# Patient Record
Sex: Male | Born: 1970 | Hispanic: No | Marital: Married | Smoking: Current every day smoker
Health system: Southern US, Community
[De-identification: ages and names within clinical notes are randomized; demographics above are authoritative.]

## PROBLEM LIST (undated history)

## (undated) DIAGNOSIS — E785 Hyperlipidemia, unspecified: Secondary | ICD-10-CM

## (undated) DIAGNOSIS — K219 Gastro-esophageal reflux disease without esophagitis: Secondary | ICD-10-CM

## (undated) DIAGNOSIS — G8929 Other chronic pain: Secondary | ICD-10-CM

## (undated) DIAGNOSIS — I10 Essential (primary) hypertension: Secondary | ICD-10-CM

## (undated) DIAGNOSIS — G4733 Obstructive sleep apnea (adult) (pediatric): Secondary | ICD-10-CM

## (undated) DIAGNOSIS — M25512 Pain in left shoulder: Secondary | ICD-10-CM

## (undated) DIAGNOSIS — G4730 Sleep apnea, unspecified: Secondary | ICD-10-CM

## (undated) DIAGNOSIS — M2550 Pain in unspecified joint: Secondary | ICD-10-CM

## (undated) DIAGNOSIS — D72829 Elevated white blood cell count, unspecified: Secondary | ICD-10-CM

## (undated) HISTORY — PX: KNEE SURGERY: SHX244

## (undated) HISTORY — PX: FOOT SURGERY: SHX648

## (undated) HISTORY — DX: Essential (primary) hypertension: I10

## (undated) HISTORY — DX: Elevated white blood cell count, unspecified: D72.829

## (undated) HISTORY — DX: Hyperlipidemia, unspecified: E78.5

## (undated) HISTORY — DX: Sleep apnea, unspecified: G47.30

## (undated) HISTORY — PX: ANTERIOR CRUCIATE LIGAMENT REPAIR: SHX115

## (undated) HISTORY — PX: APPENDECTOMY: SHX54

## (undated) HISTORY — DX: Gastro-esophageal reflux disease without esophagitis: K21.9

## (undated) HISTORY — DX: Pain in unspecified joint: M25.50

## (undated) HISTORY — DX: Obstructive sleep apnea (adult) (pediatric): G47.33

---

## 2001-04-05 ENCOUNTER — Ambulatory Visit (HOSPITAL_COMMUNITY): Admission: RE | Admit: 2001-04-05 | Discharge: 2001-04-05 | Payer: Self-pay | Admitting: Gastroenterology

## 2003-06-06 ENCOUNTER — Emergency Department (HOSPITAL_COMMUNITY): Admission: EM | Admit: 2003-06-06 | Discharge: 2003-06-07 | Payer: Self-pay | Admitting: Emergency Medicine

## 2003-08-24 ENCOUNTER — Emergency Department (HOSPITAL_COMMUNITY): Admission: EM | Admit: 2003-08-24 | Discharge: 2003-08-24 | Payer: Self-pay | Admitting: Family Medicine

## 2004-02-11 ENCOUNTER — Ambulatory Visit (HOSPITAL_COMMUNITY): Admission: RE | Admit: 2004-02-11 | Discharge: 2004-02-11 | Payer: Self-pay | Admitting: Internal Medicine

## 2004-04-06 ENCOUNTER — Encounter: Payer: Self-pay | Admitting: Emergency Medicine

## 2004-04-06 ENCOUNTER — Observation Stay (HOSPITAL_COMMUNITY): Admission: EM | Admit: 2004-04-06 | Discharge: 2004-04-07 | Payer: Self-pay | Admitting: General Surgery

## 2004-04-06 ENCOUNTER — Encounter (INDEPENDENT_AMBULATORY_CARE_PROVIDER_SITE_OTHER): Payer: Self-pay | Admitting: Specialist

## 2004-04-09 ENCOUNTER — Emergency Department (HOSPITAL_COMMUNITY): Admission: EM | Admit: 2004-04-09 | Discharge: 2004-04-09 | Payer: Self-pay | Admitting: Emergency Medicine

## 2004-09-11 ENCOUNTER — Emergency Department (HOSPITAL_COMMUNITY): Admission: EM | Admit: 2004-09-11 | Discharge: 2004-09-11 | Payer: Self-pay | Admitting: Emergency Medicine

## 2005-09-21 ENCOUNTER — Ambulatory Visit: Payer: Self-pay | Admitting: Internal Medicine

## 2005-10-10 ENCOUNTER — Ambulatory Visit: Payer: Self-pay | Admitting: Pulmonary Disease

## 2005-10-17 ENCOUNTER — Ambulatory Visit (HOSPITAL_COMMUNITY): Admission: RE | Admit: 2005-10-17 | Discharge: 2005-10-17 | Payer: Self-pay | Admitting: Pulmonary Disease

## 2005-12-10 ENCOUNTER — Ambulatory Visit (HOSPITAL_BASED_OUTPATIENT_CLINIC_OR_DEPARTMENT_OTHER): Admission: RE | Admit: 2005-12-10 | Discharge: 2005-12-10 | Payer: Self-pay | Admitting: Pulmonary Disease

## 2006-01-11 ENCOUNTER — Ambulatory Visit: Payer: Self-pay | Admitting: Pulmonary Disease

## 2006-03-27 ENCOUNTER — Ambulatory Visit: Payer: Self-pay | Admitting: Pulmonary Disease

## 2006-04-12 ENCOUNTER — Encounter: Admission: RE | Admit: 2006-04-12 | Discharge: 2006-04-12 | Payer: Self-pay | Admitting: Neurology

## 2007-01-18 ENCOUNTER — Ambulatory Visit: Payer: Self-pay | Admitting: Internal Medicine

## 2007-01-23 ENCOUNTER — Ambulatory Visit: Payer: Self-pay | Admitting: Internal Medicine

## 2007-01-23 LAB — CONVERTED CEMR LAB
BUN: 10 mg/dL (ref 6–23)
Basophils Relative: 0.2 % (ref 0.0–1.0)
Bilirubin, Direct: 0.1 mg/dL (ref 0.0–0.3)
CO2: 26 meq/L (ref 19–32)
Eosinophils Relative: 2.2 % (ref 0.0–5.0)
GFR calc Af Amer: 109 mL/min
Glucose, Bld: 93 mg/dL (ref 70–99)
HDL: 28.7 mg/dL — ABNORMAL LOW (ref >39.0)
Hemoglobin: 15.6 g/dL (ref 13.0–17.0)
Lymphocytes Relative: 39.5 % (ref 12.0–46.0)
Monocytes Absolute: 1 10*3/uL — ABNORMAL HIGH (ref 0.2–0.7)
Monocytes Relative: 9.6 % (ref 3.0–11.0)
Neutro Abs: 5.4 10*3/uL (ref 1.4–7.7)
Potassium: 3.9 meq/L (ref 3.5–5.1)
Sodium: 141 meq/L (ref 135–145)
TSH: 1.77 microintl units/mL (ref 0.35–5.50)
Testosterone: 486.15 ng/dL (ref 350.00–890)
Total Protein: 6.8 g/dL (ref 6.0–8.3)
VLDL: 21 mg/dL (ref 0–40)

## 2007-03-01 ENCOUNTER — Encounter: Payer: Self-pay | Admitting: Internal Medicine

## 2007-03-26 ENCOUNTER — Emergency Department (HOSPITAL_COMMUNITY): Admission: EM | Admit: 2007-03-26 | Discharge: 2007-03-26 | Payer: Self-pay | Admitting: Family Medicine

## 2007-04-03 ENCOUNTER — Ambulatory Visit: Payer: Self-pay | Admitting: Internal Medicine

## 2007-04-03 DIAGNOSIS — E785 Hyperlipidemia, unspecified: Secondary | ICD-10-CM | POA: Insufficient documentation

## 2007-04-03 DIAGNOSIS — L259 Unspecified contact dermatitis, unspecified cause: Secondary | ICD-10-CM | POA: Insufficient documentation

## 2007-05-07 ENCOUNTER — Encounter (INDEPENDENT_AMBULATORY_CARE_PROVIDER_SITE_OTHER): Payer: Self-pay | Admitting: *Deleted

## 2007-05-09 ENCOUNTER — Telehealth: Payer: Self-pay | Admitting: Internal Medicine

## 2007-05-20 ENCOUNTER — Encounter: Payer: Self-pay | Admitting: Internal Medicine

## 2007-05-23 ENCOUNTER — Ambulatory Visit: Payer: Self-pay | Admitting: Internal Medicine

## 2007-05-30 ENCOUNTER — Emergency Department (HOSPITAL_COMMUNITY): Admission: EM | Admit: 2007-05-30 | Discharge: 2007-05-31 | Payer: Self-pay | Admitting: Emergency Medicine

## 2007-05-31 ENCOUNTER — Ambulatory Visit: Payer: Self-pay | Admitting: Internal Medicine

## 2007-05-31 DIAGNOSIS — J029 Acute pharyngitis, unspecified: Secondary | ICD-10-CM | POA: Insufficient documentation

## 2007-10-28 ENCOUNTER — Encounter: Payer: Self-pay | Admitting: Internal Medicine

## 2008-12-17 ENCOUNTER — Emergency Department (HOSPITAL_BASED_OUTPATIENT_CLINIC_OR_DEPARTMENT_OTHER): Admission: EM | Admit: 2008-12-17 | Discharge: 2008-12-17 | Payer: Self-pay | Admitting: Emergency Medicine

## 2008-12-28 ENCOUNTER — Encounter: Payer: Self-pay | Admitting: Internal Medicine

## 2009-05-19 ENCOUNTER — Ambulatory Visit (HOSPITAL_COMMUNITY): Admission: RE | Admit: 2009-05-19 | Discharge: 2009-05-19 | Payer: Self-pay | Admitting: Orthopedic Surgery

## 2009-06-22 ENCOUNTER — Emergency Department (HOSPITAL_COMMUNITY): Admission: EM | Admit: 2009-06-22 | Discharge: 2009-06-22 | Payer: Self-pay | Admitting: Emergency Medicine

## 2009-10-21 ENCOUNTER — Emergency Department (HOSPITAL_COMMUNITY): Admission: EM | Admit: 2009-10-21 | Discharge: 2009-10-21 | Payer: Self-pay | Admitting: Emergency Medicine

## 2010-01-14 ENCOUNTER — Ambulatory Visit
Admission: RE | Admit: 2010-01-14 | Discharge: 2010-01-14 | Payer: Self-pay | Source: Home / Self Care | Attending: Internal Medicine | Admitting: Internal Medicine

## 2010-01-14 ENCOUNTER — Encounter: Payer: Self-pay | Admitting: Internal Medicine

## 2010-01-14 LAB — CONVERTED CEMR LAB
Calcium: 9.6 mg/dL (ref 8.4–10.5)
Cholesterol: 195 mg/dL (ref 0–200)
HDL: 34 mg/dL — ABNORMAL LOW (ref >39)
Sodium: 141 meq/L (ref 135–145)
Total CHOL/HDL Ratio: 5.7

## 2010-01-25 ENCOUNTER — Encounter: Payer: Self-pay | Admitting: Internal Medicine

## 2010-01-26 DIAGNOSIS — G4700 Insomnia, unspecified: Secondary | ICD-10-CM | POA: Insufficient documentation

## 2010-02-03 NOTE — Letter (Signed)
   [facility] [address] [place], [place]  [address]  [place] Phone: [phone]      January 25, 2010   [facility] [address] [place], [place] [address]  RE:  LAB RESULTS  Dear  Mr. Fichter,  The following is an interpretation of your most recent lab tests.  Please take note of any instructions provided or changes to medications that have resulted from your lab work.  ELECTROLYTES:  Good - no changes needed  KIDNEY FUNCTION TESTS:  Good - no changes needed  LIPID PANEL:  Fair - review at your next visit Triglyceride: 227   Cholesterol: 195   LDL: 116   HDL: 34   Chol/HDL%:  5.7 Ratio   C Reactive Protein - 1.9 (borderline)   Please follow low saturated fat diet.  I suggest you take vitatmin D supplement 1000 units once daily (over the counter).  Regular exercise can also help lower cholesterol levels.     Sincerely Yours,    Dr. Thomos Lemons  Appended Document:  mailed

## 2010-02-03 NOTE — Assessment & Plan Note (Signed)
Summary: bp med refill/mhf   Vital Signs:  Patient profile:   40 year old male Height:      67.5 inches Weight:      173.50 pounds BMI:     26.87 O2 Sat:      100 % on Room air Temp:     97.6 degrees F oral Pulse rate:   81 / minute Resp:     16 per minute BP sitting:   124 / 80  (right arm) Cuff size:   large  Vitals Entered By: Glendell Docker CMA (January 14, 2010 8:22 AM)  O2 Flow:  Room air CC: Medication Refills Is Patient Diabetic? No Pain Assessment Patient in pain? no        Primary Care Provider:  Dondra Spry DO  CC:  Medication Refills.  History of Present Illness:  Hypertension Follow-Up      This is a 40 year old man who presents for Hypertension follow-up.  The patient denies lightheadedness, headaches, edema, and impotence.  The patient denies the following associated symptoms: chest pain.    chronic insomnia - no change  Preventive Screening-Counseling & Management  Alcohol-Tobacco     Alcohol drinks/day: 0     Smoking Status: quit  Caffeine-Diet-Exercise     Caffeine use/day: 3 beverages daily     Does Patient Exercise: no  Allergies (verified): No Known Drug Allergies  Past History:  Past Medical History: Hypertension Hyperlipidemia    Social History: Does Patient Exercise:  no Caffeine use/day:  3 beverages daily  Physical Exam  General:  alert, well-developed, and well-nourished.   Neck:  supple, no masses, and no carotid bruits.   Lungs:  normal respiratory effort and normal breath sounds.   Heart:  normal rate, regular rhythm, and no gallop.     Complete Medication List: 1)  Zolpidem Tartrate 10 Mg Tabs (Zolpidem tartrate) .... One by mouth at bedtime as needed 2)  Bystolic 10 Mg Tabs (Nebivolol hcl) .... Take 1 tablet by mouth once a day  Other Orders: T-Basic Metabolic Panel [id number]) T-Lipid Profile [phone]) CRP, high sensitivity-[facility] ([id number])  Patient Instructions: 1)  Please schedule a follow-up  appointment in 6 months. Prescriptions: ZOLPIDEM TARTRATE 10 MG TABS (ZOLPIDEM TARTRATE) one by mouth at bedtime as needed  #90 x 1   Entered and Authorized by:   D. Thomos Lemons DO   Signed by:   D. Thomos Lemons DO on 01/14/2010   Method used:   Print then Give to Patient   RxID:   [id number] BYSTOLIC 10 MG TABS (NEBIVOLOL HCL) Take 1 tablet by mouth once a day  #90 x 1   Entered and Authorized by:   D. Thomos Lemons DO   Signed by:   D. Thomos Lemons DO on 01/14/2010   Method used:   Electronically to        [facility]* (retail)             ,          Ph: [phone]       Fax: [phone]   RxID:   [id number]    Orders Added: 1)  T-Basic Metabolic Panel [id number] 2)  T-Lipid Profile [[id number]] 3)  CRP, high sensitivity-[facility] [[id number]] 4)  Est. Patient Level III [71062]   Immunization History:  Influenza Immunization History:    Influenza:  declined (01/14/2010)   Contraindications/Deferment of Procedures/Staging:    Test/Procedure: FLU VAX    Reason for deferment: patient declined  Immunization History:  Influenza Immunization History:    Influenza:  Declined (01/14/2010)    Current Allergies (reviewed today): No known allergies   Appended Document: bp med refill/mhf      Impression & Recommendations:  Problem # 1:  HYPERTENSION (ICD-401.9) Assessment Improved  His updated medication list for this problem includes:    Bystolic 10 Mg Tabs (Nebivolol hcl) .Marland Kitchen... Take 1 tablet by mouth once a day  Prior BP: 124/80 (01/14/2010)  Labs Reviewed: K+: 4.3 (01/14/2010) Creat: : 1.00 (01/14/2010)   Chol: 195 (01/14/2010)   HDL: 34 (01/14/2010)   LDL: 116 (01/14/2010)   TG: 227 (01/14/2010)  Problem # 2:  INSOMNIA, CHRONIC (ICD-307.42) Assessment: Unchanged  Complete Medication List: 1)  Zolpidem Tartrate 10 Mg Tabs (Zolpidem tartrate) .... One by mouth at bedtime as needed 2)  Bystolic 10 Mg Tabs (Nebivolol hcl) .... Take 1 tablet by  mouth once a day

## 2010-03-21 LAB — URINALYSIS, ROUTINE W REFLEX MICROSCOPIC
Bilirubin Urine: NEGATIVE
Ketones, ur: NEGATIVE mg/dL
Nitrite: NEGATIVE
Protein, ur: NEGATIVE mg/dL
Urobilinogen, UA: 0.2 mg/dL (ref 0.0–1.0)
pH: 5 (ref 5.0–8.0)

## 2010-03-21 LAB — COMPREHENSIVE METABOLIC PANEL
Albumin: 4.4 g/dL (ref 3.5–5.2)
BUN: 11 mg/dL (ref 6–23)
Chloride: 109 mEq/L (ref 96–112)
Creatinine, Ser: 0.99 mg/dL (ref 0.4–1.5)
Glucose, Bld: 89 mg/dL (ref 70–99)
Total Bilirubin: 0.6 mg/dL (ref 0.3–1.2)

## 2010-03-21 LAB — DIFFERENTIAL
Basophils Absolute: 0 10*3/uL (ref 0.0–0.1)
Lymphocytes Relative: 21 % (ref 12–46)
Monocytes Absolute: 1.5 10*3/uL — ABNORMAL HIGH (ref 0.1–1.0)
Neutro Abs: 10.8 10*3/uL — ABNORMAL HIGH (ref 1.7–7.7)
Neutrophils Relative %: 68 % (ref 43–77)

## 2010-03-21 LAB — CBC
HCT: 45.3 % (ref 39.0–52.0)
MCV: 91.3 fL (ref 78.0–100.0)
Platelets: 244 10*3/uL (ref 150–400)
RDW: 13 % (ref 11.5–15.5)

## 2010-03-21 LAB — PROTIME-INR
INR: 0.98 (ref 0.00–1.49)
Prothrombin Time: 12.9 seconds (ref 11.6–15.2)

## 2010-03-21 LAB — APTT: aPTT: 32 seconds (ref 24–37)

## 2010-05-20 NOTE — Procedures (Signed)
EEG NUMBER:  [id number]   REQUESTING PHYSICIAN:  Barbaraann Share, MD, [facility]   CLINICAL HISTORY:  This is a routine EEG done with photic stimulation and  hyperventilation the patient describes as awake and asleep.  This is a 40-  year-old man being evaluated for nocturnal seizures described as tonic  clonic events.   DESCRIPTION:  The dominant rhythm in this tracing is a moderate amplitude  alpha rhythm of 10 Hertz which predominates posteriorly.  Appears without  abnormal asymmetry and attenuates with eye opening and closing.  Low  amplitude fast activity seen frontally and centrally and appears without  abnormal asymmetry.  No focal slowing is noted.  No epileptiform discharges  are seen.  Drowsiness occurs naturally and is evidenced by fragmentation of  the background and generalized attenuation of the rhythms.  In the middle of  the recording stage, stage 2 sleep was recorded as evidenced by the  appearance of sleep spindles and some vertex waves.  No abnormalities were  seen in these stages of sleep.  Photic stimulation produced symmetric  driving responses.  Hyperventilation produced mild buildup of the background  rhythms without appearance of focal abnormality.  Single channel devoted to  EKG reveals sinus rhythm throughout with a rate of approximately 78 beats  per minute.   CONCLUSION:  Normal study in the awake, drowsy and light sleep states.  Given that the patient is having nocturnal seizures, sleep deprived EEG or  sleep study with full EEG montage may provide more information.      Michael L. Thad Ranger, M.D.  Electronically Signed     WUX:LKGM  D:  10/17/2005 20:51:35  T:  10/19/2005 06:40:53  Job #:  [id number]

## 2010-05-20 NOTE — Procedures (Signed)
NAME:  Francisco Juarez, Francisco Juarez NO.:  [id number]   MEDICAL RECORD NO.:  [id number]          PATIENT TYPE:  OUT   LOCATION:  SLEEP CENTER                 FACILITY:  [facility]   PHYSICIAN:  Barbaraann Share, MD,FCCPDATE OF BIRTH:  01/11/71   DATE OF STUDY:  12/10/2005                            NOCTURNAL POLYSOMNOGRAM   REFERRING PHYSICIAN:  Marcelyn Bruins MD   INDICATION FOR STUDY:  Other sleep disturbance.   EPWORTH SLEEPINESS SCORE:  8.   SLEEP ARCHITECTURE:  The patient had a total sleep time of 352 minutes  with no slow wave sleep being achieved and 80 minutes of REM.  Sleep  onset latency was normal as was REM onset.  Sleep efficiency was  decreased at 85%.   RESPIRATORY DATA:  The patient was found to have 1 apnea and no  hypopneas, for a respiratory disturbance index of less than 1.  There  was moderate snoring noted.   OXYGEN DATA:  There was O2 desaturation as low as 91% with the patient's  rare obstructive event.   CARDIAC DATA:  The patient had a few episodes of mild sinus tachycardia  which were very self-limiting.  There was no clinically significant  arrhythmia noted.   MOVEMENT-PARASOMNIA:  The patient was not found to have leg jerks or  other abnormal behaviors.  However, he was noted to have periods of  increased electrical activity throughout the study that were not typical  for seizure spikes.  These occurred in all leads of the expanded  montage, however there was no motor activity noted in the eye leads,  chin leads, or leg leads.  It is unclear whether this is some type of  atypical brain wave, or whether this could represent increased motor  activity in the patient's neck as translated by the mastoid reference  electrodes.  Clinical correlation is suggested.   IMPRESSIONS-RECOMMENDATIONS:  1. There is no evidence for clinically significant sleep disordered      breathing.  2. No unusual behaviors or other findings consistent with a  parasomnia.  3. Periods of increased electrical activity throughout the night that      were not consistent with seizures, and not      associated with any type of motor activity in any of the other      leads.  It should be noted the patient had a totally normal sleep      deprived EEG prior to the sleep study.      Barbaraann Share, MD,FCCP  Diplomate, American Board of Sleep  Medicine     [facility]  D:  01/05/2006 15:12:29  T:  01/05/2006 16:48:55  Job:  [id number]

## 2010-05-20 NOTE — Assessment & Plan Note (Signed)
Francisco Juarez                               PULMONARY OFFICE NOTE   Francisco Juarez, Francisco Juarez                         MRN:          [id number]  DATE:10/10/2005                            DOB:          [birth year]    HISTORY OF PRESENT ILLNESS:  The patient is a 40 year old gentleman who I  have been asked to see regarding abnormal nocturnal behaviors.  The  patient's wife states that she has seen her husband have head shaking that  then leads to upper body and then leg shaking.  She will sometimes note the  patient's leg can extend and stiffen.  The patient has also been noted to  have very loud snoring with snoring arousals and no mentioned pauses in his  breathing in his sleep.  The patient typically works third shift and will go  to bed between 9-11 a.m. and get up between 4-5 p.m.  He will occasionally  work a first shift and then he will sleep 4 p.m. to 10 p.m. prior to going  to his third shift.  The patient states that sometimes he is rested whenever  he arises and other times not.  He will occasionally take Tylenol PM for  sleep, but really has no difficulty falling asleep.  The patient has no  symptoms consistent with restless leg syndrome, but the wife does not  kicking during the night.  He has no history of a seizure disorder, nor has  he ever had any traumatic brain injury or neurologic issues.   PAST MEDICAL HISTORY:  1. Hypertension.  2. Status post appendectomy.   CURRENT MEDICATIONS:  None.   ALLERGIES:  No known drug allergies.   SOCIAL HISTORY:  He has a history of smoking 1-1/2 packs per day x10 years.  He has not smoked since April 2006.  He is married.   FAMILY HISTORY:  Unremarkable in first-degree relatives.   REVIEW OF SYSTEMS:  As in history of present illness and also see patient  intake form documented in the chart.   PHYSICAL EXAMINATION:  GENERAL:  He is a well-developed, white male in no  acute distress.  VITAL SIGNS:   Blood pressure 128/92, pulse 75, temperature 98, weight 158  pounds, saturations on room air 98%.  HEENT:  Pupils equal round and reactive to light and accommodation.  Extraocular movements intact.  Nares show mild septal deviation at the left.  Oropharynx shows mild elongation of soft palate and uvula.  NECK:  Supple without JVD or lymphadenopathy.  No palpable thyromegaly.  CHEST:  Totally clear.  CARDIAC:  Regular rate and rhythm without murmurs, rubs or gallops.  ABDOMEN:  Soft, nontender, good bowel sounds.  GENITALIA:  Not done, not indicated.  RECTAL:  Not done, not indicated.  EXTREMITIES:  Lower extremities are without edema, good pulses distally.  No  calf tenderness.  NEUROLOGIC:  Alert and oriented with no increased motor deficits.   IMPRESSION:  Questionable nocturnal seizures versus abnormal movement noted  due to possible sleep apnea or other parasomnia.  At this point in time, I  think the patient needs to have a sleep deprived EEG and if this is normal,  would go ahead and proceed with nocturnal polysomnogram.   PLAN:  1. Sleep deprived EEG.  2. If negative, would proceed with NPSG.    ______________________________  Barbaraann Share, MD,FCCP    [facility]  DD: 11/10/2005  DT: 11/11/2005  Job #: [id number]   cc:   Barbette Hair. Artist Pais, DO

## 2010-05-20 NOTE — Assessment & Plan Note (Signed)
Francisco Juarez                             PRIMARY CARE OFFICE NOTE   RONIN, REHFELDT                         MRN:          [id number]  DATE:09/21/2005                            DOB:          [birth year]    CHIEF COMPLAINT:  New Francisco Juarez and practice.   HISTORY OF PRESENT ILLNESS:  Francisco Juarez is a 40 year old white male here to  establish primary care. Francisco Juarez has not had a primary care physician in the  past, his wife has recently established with our group and requests  evaluation for sleep disorder. 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 His wife has noted recently that Francisco Juarez has  involuntary movements, somewhat disturbing to the patients wife that were  described as head shaking against his pillow and then involuntary movements  spreading to his arms and legs. Francisco Juarez often awakens with poor sleep and  grogginess.   There is no reported history of head trauma or head injury, he did have a  sports accident in the past where he had a contusion below and beneath his  right eye.   Otherwise, has had no major illnesses or hospitalizations, he denies any  cardiac history, his only hospitalization was for appendectomy in 2006.   MEDICATIONS:  None.   ALLERGIES:  None.   SOCIAL HISTORY:  Francisco Juarez is married, accompanied by his wife today, works as  a Therapist, occupational.   FAMILY HISTORY:  Positive for high blood pressure, diabetes and early  coronary disease.   HABITS:  No alcohol, remote tobacco, no reported recreational drug use.   REVIEW OF SYSTEMS:  No fevers or chills.  No HEENT symptoms, denies any  chest pain or cough, no heartburn, nausea, vomiting, constipation, diarrhea,  no dark stools or blood in his stool. All the systems negative.   PHYSICAL  EXAMINATION:  VITAL SIGNS: Height 5'7 1/2, Weight: 162 pounds,  temperature is 97.8, pulse is 72, blood pressure is 142/90 in the left arm  in the seated position.  GENERAL: The Francisco Juarez is a well-developed, well-nourished, 40 year old white  male, in no apparent distress.  HEENT: Normocephalic, atraumatic, pupils were equal and reactive to light  bilaterally.  Extraocular motility was intact.  The Francisco Juarez was anicteric.  Conjunctivae was within normal limits.  Auditory canals were clear  bilaterally.  Oropharyngeal exam was unremarkable.  NECK: Supple, no adenopathy, carotid bruit, or thyromegaly.  CHEST:  Normal inspiratory effort.  Chest was clear to auscultation  bilaterally. No rhonchi, rales or wheezing.  CARDIOVASCULAR: Regular rate and rhythm, no significant murmurs, rubs or  gallops appreciated.  ABDOMEN: Soft, nontender, positive bowel sounds, no organomegaly.  MUSCULOSKELETAL: No cyanosis, clubbing or edema.  NEUROLOGIC: No cerebellar signs, no pronator drift, Francisco Juarez had muscle  strength 5 out of 5 throughout, reflexes were 0 to 1 upper extremity, +2  patellar and +1 Achilles. Patients mood and affect was appropriate.  SKIN:  Francisco Juarez has multiple freckling of his upper back, examination of his  chest revealed hyper-pigmented slightly raised lesion on the right side of  his chest, approximately 8 millimeters with somewhat irregular border.   IMPRESSION:  1. Probable periodic limb movement disorder.  2. Elevated blood pressure without diagnosis, hypertension.  3. Abnormal mole on chest.  4. Health maintenance.   RECOMMENDATIONS:  The Francisco Juarez will be referred to Dr. Shelle Iron for possible  sleep study.   The Francisco Juarez is to keep a log of his blood pressure readings, at least ten  before followup visit. We discussed performing a shave biopsy on chest skin  lesion on followup visit. Followup time is at approximately one month.                                   Barbette Hair. Artist Pais,  DO   RDY/MedQ  DD:  09/21/2005  DT:  09/23/2005  Job #:  [id number]

## 2010-05-20 NOTE — H&P (Signed)
NAME:  Francisco Juarez, Francisco Juarez NO.:  [id number]   MEDICAL RECORD NO.:  [id number]          PATIENT TYPE:  OBV   LOCATION:  0106                         [facility]ACILITY[facility]  [facility]   PHYSICIAN:  Sharlet Salina T. Hoxworth, M.D.DATE OF BIRTH:  [birth year]   DATE OF ADMISSION:  04/06/2004  DATE OF DISCHARGE:                                HISTORY & PHYSICAL   CHIEF COMPLAINT:  Abdominal pain.   HISTORY OF PRESENT ILLNESS:  Francisco Juarez is a generally healthy 40 year old  male who presents with 24 hours of progressively worsening lower abdominal  pain.  He describes aching pain in his midline low abdomen.  This is  constant with some waxing and waning.  There is no radiation.  He has had  anorexia and fever but no nausea and vomiting.  He has no history of any  similar pain or chronic GI complaints.  The pain is worse with any motion.   PAST MEDICAL HISTORY:  No previous medical or surgical illness or  hospitalizations.   MEDICATIONS:  None.   ALLERGIES:  None.   SOCIAL HISTORY:  He is married.  Works at [employer].  Does not drink alcohol.  Smokes a pack of cigarettes per day.   FAMILY HISTORY:  Noncontributory.   REVIEW OF SYSTEMS:  GENERAL:  Positive for fever.  RESPIRATORY:  He states  that he has had a little shortness of breath during this illness.  No long-  term shortness of breath, cough, wheezing, or history of asthma.  CARDIAC:  No palpitations or chest pain.  ABDOMEN/GI:  As above.  GU:  No urinary  burning, frequency, or blood.   PHYSICAL EXAMINATION:  VITAL SIGNS:  Temperature 98.2, pulse 89,  respirations 20, blood pressure 130/88.  GENERAL:  A well-developed white male in no acute distress.  SKIN:  Warm and dry without rash or infection.  HEENT/NECK:  No palpable mass or thyromegaly.  Sclerae are anicteric.  LUNGS:  Clear to auscultation without wheezing or increasing work of  breathing.  CARDIAC:  Regular rate and rhythm without murmurs.  No edema.  No JVD.  ABDOMEN:   Hypoactive bowel sounds.  Nondistended.  There is well-localized  right lower quadrant tenderness extending over towards the midline with  guarding.  Questionable perineal irritation.  No hepatosplenomegaly or mass  detected.  EXTREMITIES:  No joint swelling or deformity.  NEUROLOGIC:  Alert and oriented.  Motor and sensory exam is grossly normal.   LABORATORY:  White count is elevated at 17.2 thousand.  Hemoglobin 15.7.  Urinalysis, electrolytes, and LFTs normal.   CT scan of the abdomen and pelvis had been obtained from the emergency room,  which I personally reviewed, which showed evidence of acute appendicitis  without perforation.   ASSESSMENT/PLAN:  Apparent acute appendicitis:  The patient is being treated  with IV fluids and antibiotics.  Will be taken to the operating room for  emergency appendectomy.      BTH/MEDQ  D:  04/06/2004  T:  04/06/2004  Job:  [id number]

## 2010-05-20 NOTE — Op Note (Signed)
NAME:  Francisco Juarez, Francisco Juarez NO.:  [id number]   MEDICAL RECORD NO.:  [id number]          PATIENT TYPE:  OBV   LOCATION:  0106                         FACILITY:  [facility]   PHYSICIAN:  Sharlet Salina T. Hoxworth, M.D.DATE OF BIRTH:  Dec 15, 1970   DATE OF PROCEDURE:  04/06/2004  DATE OF DISCHARGE:                                 OPERATIVE REPORT   PREOPERATIVE DIAGNOSIS:  Acute appendicitis.   POSTOPERATIVE DIAGNOSIS:  Acute appendicitis.   SURGICAL PROCEDURE:  Laparoscopic appendectomy.   SURGEON:  Dr. Johna Sheriff   ANESTHESIA:  General.   BRIEF HISTORY:  Giancarlos Berendt is a 40 year old male, who presents with 24  hours of progressive lower abdominal pain and anorexia and fever.  CT scan  was obtained in the emergency room which shows evidence of acute  appendicitis without perforation.  Laparoscopic appendectomy has been  recommended and accepted.  The nature of the procedure, indications, risks  of bleeding, infection, possible need for open procedure were discussed  understood.  He is now brought to the operating room for this procedure.   DESCRIPTION OF OPERATION:  The patient brought to the operating room, placed  supine position on operating table, and general endotracheal anesthesia was  induced.  He received broad spectrum antibiotics preoperatively.  The  abdomen was widely sterilely prepped and draped.  Local anesthesia was used  to infiltrate the trocar sites prior to the incisions.  A 1 cm incision was  made at the umbilicus and dissection carried down to the midline fascia  which was sharply incised for 1 cm and the peritoneum entered under direct  vision.  Through a mattress sutures of 0 Vicryl, the Hasson trocar was  placed and pneumoperitoneum established.  Under direct vision, a 5 mm trocar  was placed in the right upper quadrant and an 11 mm trocar in the left lower  quadrant.  The cecum and appendix were exposed.  The appendix was lying  medially.  It appeared  acutely inflamed without gangrene or perforation.  The appendix was elevated and the base of the appendix and mesoappendix  exposed.  The mesoappendix was sequentially divided with the harmonic  scalpel.  Some lateral peritoneal attachments of the appendix were divided.  The appendix was completely freed down to its base which was relatively not  inflamed.  The appendix was then divided at or just below its space with a  single firing of the EndoGIA 45 mm stapler.  There was a small bleeding  point along the staple line that was controlled with clips.  The appendix  was placed in the EndoCatch bag and brought out through the umbilicus.  The  right lower quadrant was then thoroughly irrigated and hemostasis assured.  Trocars removed under direct vision and all CO2 evacuated.  The mattress  suture was secured at the umbilicus.  Skin incisions were closed with  interrupted subcuticular 4-0 Monocryl and Steri-Strips.  Sponge, needle, and  instrument counts were correct.  Dry sterile dressings were applied and the  patient taken to recovery in good condition.      BTH/MEDQ  D:  04/06/2004  T:  04/06/2004  Job:  [id number]

## 2010-07-01 ENCOUNTER — Encounter: Payer: Self-pay | Admitting: Internal Medicine

## 2010-07-11 ENCOUNTER — Ambulatory Visit: Payer: Self-pay | Admitting: Internal Medicine

## 2010-07-11 DIAGNOSIS — Z0289 Encounter for other administrative examinations: Secondary | ICD-10-CM

## 2010-12-02 ENCOUNTER — Other Ambulatory Visit (INDEPENDENT_AMBULATORY_CARE_PROVIDER_SITE_OTHER): Payer: 59

## 2010-12-02 DIAGNOSIS — Z0000 Encounter for general adult medical examination without abnormal findings: Secondary | ICD-10-CM

## 2010-12-02 LAB — BASIC METABOLIC PANEL
BUN: 11 mg/dL (ref 6–23)
Calcium: 9.3 mg/dL (ref 8.4–10.5)
Creatinine, Ser: 1 mg/dL (ref 0.4–1.5)
GFR: 86.42 mL/min (ref >60.00)
Glucose, Bld: 77 mg/dL (ref 70–99)

## 2010-12-02 LAB — POCT URINALYSIS DIPSTICK
Bilirubin, UA: NEGATIVE
Ketones, UA: NEGATIVE
pH, UA: 5

## 2010-12-02 LAB — CBC WITH DIFFERENTIAL/PLATELET
Basophils Absolute: 0 10*3/uL (ref 0.0–0.1)
Eosinophils Relative: 1.2 % (ref 0.0–5.0)
Lymphs Abs: 4.1 10*3/uL — ABNORMAL HIGH (ref 0.7–4.0)
Monocytes Relative: 9.8 % (ref 3.0–12.0)
Neutrophils Relative %: 55 % (ref 43.0–77.0)
Platelets: 264 10*3/uL (ref 150.0–400.0)
RDW: 13.3 % (ref 11.5–14.6)
WBC: 12.3 10*3/uL — ABNORMAL HIGH (ref 4.5–10.5)

## 2010-12-02 LAB — LIPID PANEL: HDL: 34.4 mg/dL — ABNORMAL LOW (ref >39.00)

## 2010-12-02 LAB — HEPATIC FUNCTION PANEL
AST: 18 U/L (ref 0–37)
Albumin: 4.4 g/dL (ref 3.5–5.2)
Bilirubin, Direct: 0 mg/dL (ref 0.0–0.3)
Total Bilirubin: 0.6 mg/dL (ref 0.3–1.2)
Total Protein: 7.2 g/dL (ref 6.0–8.3)

## 2010-12-02 LAB — TSH: TSH: 1.77 u[IU]/mL (ref 0.35–5.50)

## 2010-12-08 ENCOUNTER — Telehealth: Payer: Self-pay | Admitting: Internal Medicine

## 2010-12-08 NOTE — Telephone Encounter (Signed)
Refill-zolpidem tartrate 10mg  tab. Take one tablet by mouth daily at bedtime as needed. Qty 90. Last fill 5.7.12  Pharmacy notes: no refills remain for this prescription.

## 2010-12-09 MED ORDER — ZOLPIDEM TARTRATE 10 MG PO TABS: 10.0000 mg | ORAL_TABLET | Freq: Every evening | ORAL | Status: DC | PRN

## 2010-12-09 NOTE — Telephone Encounter (Signed)
Ok to refill x 1 month only.  His last OV was almost 1 yr ago.

## 2010-12-09 NOTE — Telephone Encounter (Signed)
rx called in, pt aware 

## 2010-12-16 ENCOUNTER — Encounter: Payer: 59 | Admitting: Internal Medicine

## 2010-12-23 ENCOUNTER — Encounter: Payer: Self-pay | Admitting: Internal Medicine

## 2010-12-23 ENCOUNTER — Ambulatory Visit (INDEPENDENT_AMBULATORY_CARE_PROVIDER_SITE_OTHER): Payer: 59 | Admitting: Internal Medicine

## 2010-12-23 DIAGNOSIS — G4700 Insomnia, unspecified: Secondary | ICD-10-CM

## 2010-12-23 DIAGNOSIS — M25569 Pain in unspecified knee: Secondary | ICD-10-CM

## 2010-12-23 DIAGNOSIS — M25561 Pain in right knee: Secondary | ICD-10-CM

## 2010-12-23 DIAGNOSIS — Z0000 Encounter for general adult medical examination without abnormal findings: Secondary | ICD-10-CM

## 2010-12-23 DIAGNOSIS — D7289 Other specified disorders of white blood cells: Secondary | ICD-10-CM

## 2010-12-23 DIAGNOSIS — I10 Essential (primary) hypertension: Secondary | ICD-10-CM

## 2010-12-23 DIAGNOSIS — D729 Disorder of white blood cells, unspecified: Secondary | ICD-10-CM

## 2010-12-23 MED ORDER — ZOLPIDEM TARTRATE 10 MG PO TABS: 10.0000 mg | ORAL_TABLET | Freq: Every evening | ORAL | Status: DC | PRN

## 2010-12-23 NOTE — Assessment & Plan Note (Signed)
Blood pressure control has deteriorated. Patient reports stopping blood pressure medication approximately 6 months ago (bystolic 10 mg). His home blood pressure readings reportedly have been in the 130s to 140s. Patient advised to monitor blood pressure over the next one month and call with readings.  BP: 154/100 mmHg

## 2010-12-23 NOTE — Patient Instructions (Addendum)
Please complete the following lab tests in 6 months. CBCD, HIV antibody - 288.8 Please contact our office if you experience persistent knee pain

## 2010-12-23 NOTE — Progress Notes (Signed)
Subjective:    Patient ID: Francisco Juarez, male    DOB: 1970/05/12, 40 y.o.   MRN: [id number]  HPI  40 year old white male for routine physical. He denies any significant interval medical history. He has history of hypertension and was previously on bystolic 10 mg once daily. Patient reports tapering off medications on his own 6 months ago. He has been intermittently monitoring his blood pressure at the local pharmacy. He reports blood pressure readings in the 130s systolic.  Recent blood tests were reviewed in detail with the patient.  He has mild neutrophilia. He has no symptoms of infection. He denies chronic cough. He denies night sweats or fevers. He denies adenopathy.  Does not follow low saturated fat diet.  Patient has been seen by a podiatrist for bilateral heel pain. He received cortisone injections to both heels with significant improvement. Patient noted intermittent right knee pain. No history of trauma or injury.  Review of Systems   Constitutional: Negative for activity change, appetite change and unexpected weight change.  Eyes: Negative for visual disturbance.  Respiratory: Negative for cough, chest tightness and shortness of breath.   Cardiovascular: Negative for chest pain.  Genitourinary: Negative for difficulty urinating.  Neurological: Negative for headaches.  Gastrointestinal: Negative for abdominal pain, heartburn melena or hematochezia  Past Medical History  Diagnosis Date  . Hyperlipidemia   . Hypertension     History   Social History  . Marital Status: Married    Spouse Name: N/A    Number of Children: N/A  . Years of Education: N/A   Occupational History  . Not on file.   Social History Main Topics  . Smoking status: Former Games developer  . Smokeless tobacco: Not on file   Comment: quit April 2006, smoked 1 1/2 pack/day  x 10 years  . Alcohol Use: No  . Drug Use: No  . Sexually Active: Not on file   Other Topics Concern  . Not on file   Social  History Narrative   Reviewed history from 01/18/2007 and no changes required:      Married      Former Smoker -  quit April 2006.  smoked 1 1/2 pack/day x 10 years      Alcohol use-no      Drug use-no      Occupation:  Laboratory clinical tech     Past Surgical History  Procedure Date  . Knee surgery     left knee ligaments repair  . Anterior cruciate ligament repair     left knee  . Foot surgery     screw right foot 5th metatarsal  . 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 Appendectomy     Family History  Problem Relation Age of Onset  . Hypertension    . Diabetes    . Coronary artery disease      Allergies not on file  No current outpatient prescriptions on file prior to visit.    BP 154/100  Pulse 76  Temp(Src) 98.2 F (36.8 C) (Oral)  Ht 5\' 8"  (1.727 m)  Wt 174 lb (78.926 kg)  BMI 26.46 kg/m2      Objective:   Physical Exam   Constitutional: Appears well-developed and well-nourished. No distress.  Head: Normocephalic and atraumatic.  Ear:  Right and left ear normal.  TMs clear.  Hearing is grossly normal Mouth/Throat: Oropharynx is clear and moist.  Eyes: Conjunctivae are normal. Pupils are equal, round, and  reactive to light.  Neck: Normal range of motion. Neck supple. No thyromegaly present. No carotid bruit Cardiovascular: Normal rate, regular rhythm and normal heart sounds.  Exam reveals no gallop and no friction rub.  No murmur heard. Pulmonary/Chest: Effort normal and breath sounds normal.  No wheezes. No rales.  Abdominal: Soft. Bowel sounds are normal. No mass. There is no tenderness.  Neurological: Alert. No cranial nerve deficit.  Skin: Skin is warm and dry.  Psychiatric: Normal mood and affect. Behavior is normal.        Assessment & Plan:

## 2010-12-23 NOTE — Assessment & Plan Note (Signed)
Unchanged.  Continue zolpidem as needed. 

## 2010-12-23 NOTE — Assessment & Plan Note (Signed)
The patient has intermittent right knee pain. On exam his knee joint is stable. We discussed possibility of crystal arthropathy. His brother has history of gout. If he has persistent or worsening symptoms we discussed referral to rheumatologist.

## 2010-12-23 NOTE — Assessment & Plan Note (Addendum)
Reviewed adult health maintenance protocols. Reviewed healthy diet.  Handout for low saturated fat diet provided. He declines flu vaccine.  Last tetanus 2005.

## 2010-12-23 NOTE — Assessment & Plan Note (Signed)
Mild neutrophilia of unclear etiology. Patient has very mild lymphocytosis. Repeat CBC differential in 6 months. Obtain HIV antibody.

## 2011-04-07 ENCOUNTER — Encounter: Payer: Self-pay | Admitting: Internal Medicine

## 2011-04-07 ENCOUNTER — Other Ambulatory Visit: Payer: Self-pay | Admitting: Internal Medicine

## 2011-04-07 ENCOUNTER — Ambulatory Visit (INDEPENDENT_AMBULATORY_CARE_PROVIDER_SITE_OTHER): Payer: 59 | Admitting: Internal Medicine

## 2011-04-07 VITALS — BP 122/80 | Temp 98.3°F | Ht 68.0 in | Wt 179.0 lb

## 2011-04-07 DIAGNOSIS — D7289 Other specified disorders of white blood cells: Secondary | ICD-10-CM

## 2011-04-07 DIAGNOSIS — M79609 Pain in unspecified limb: Secondary | ICD-10-CM

## 2011-04-07 DIAGNOSIS — D729 Disorder of white blood cells, unspecified: Secondary | ICD-10-CM

## 2011-04-07 DIAGNOSIS — M79604 Pain in right leg: Secondary | ICD-10-CM | POA: Insufficient documentation

## 2011-04-07 LAB — CBC WITH DIFFERENTIAL/PLATELET
Eosinophils Relative: 1.6 % (ref 0.0–5.0)
HCT: 46.7 % (ref 39.0–52.0)
Hemoglobin: 15.6 g/dL (ref 13.0–17.0)
Lymphs Abs: 3.9 10*3/uL (ref 0.7–4.0)
Monocytes Relative: 11.2 % (ref 3.0–12.0)
Neutro Abs: 5.3 10*3/uL (ref 1.4–7.7)
RBC: 5.12 Mil/uL (ref 4.22–5.81)
WBC: 10.6 10*3/uL — ABNORMAL HIGH (ref 4.5–10.5)

## 2011-04-07 LAB — URIC ACID: Uric Acid, Serum: 8.3 mg/dL — ABNORMAL HIGH (ref 4.0–7.8)

## 2011-04-07 LAB — HEPATIC FUNCTION PANEL
ALT: 23 U/L (ref 0–53)
AST: 21 U/L (ref 0–37)
Albumin: 4.4 g/dL (ref 3.5–5.2)
Alkaline Phosphatase: 89 U/L (ref 39–117)

## 2011-04-07 LAB — BASIC METABOLIC PANEL
GFR: 79.91 mL/min (ref >60.00)
Potassium: 3.7 mEq/L (ref 3.5–5.1)
Sodium: 138 mEq/L (ref 135–145)

## 2011-04-07 NOTE — Progress Notes (Signed)
  Subjective:    Patient ID: Francisco Juarez, male    DOB: Jul 29, 1970, 41 y.o.   MRN: [id number]  HPI  41 year old Hispanic male complains of bilateral foot and knee pain over the last 1 month.  He has complained of intermittent right knee pain in the past. He describes pain in the bottoms of his foot as if he is walking on glass. He denies numbness. He denies swelling or redness. He was seen by a podiatrist several months ago and received bilateral cortisone injections. He also reports seeing an orthopedic physician and was receiving cortisone injection in both knees.  Review of Systems Negative for fevers, negative for rash  Past Medical History  Diagnosis Date  . Hyperlipidemia   . Hypertension     History   Social History  . Marital Status: Married    Spouse Name: N/A    Number of Children: N/A  . Years of Education: N/A   Occupational History  . Not on file.   Social History Main Topics  . Smoking status: Former Games developer  . Smokeless tobacco: Not on file   Comment: quit April 2006, smoked 1 1/2 pack/day  x 10 years  . Alcohol Use: No  . Drug Use: No  . Sexually Active: Not on file   Other Topics Concern  . Not on file   Social History Narrative   Reviewed history from 01/18/2007 and no changes required:      Married      Former Smoker -  quit April 2006.  smoked 1 1/2 pack/day x 10 years      Alcohol use-no      Drug use-no      Occupation:  Laboratory clinical tech     Past Surgical History  Procedure Date  . Knee surgery     left knee ligaments repair  . Anterior cruciate ligament repair     left knee  . Foot surgery     screw right foot 5th metatarsal  . 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 Appendectomy     Family History  Problem Relation Age of Onset  . Hypertension    . Diabetes    . Coronary artery disease      Allergies not on file  Current Outpatient Prescriptions on File Prior to Visit    Medication Sig Dispense Refill  . zolpidem (AMBIEN) 10 MG tablet Take 1 tablet (10 mg total) by mouth at bedtime as needed.  90 tablet  1    BP 122/80  Temp(Src) 98.3 F (36.8 C) (Oral)  Ht 5\' 8"  (1.727 m)  Wt 179 lb (81.194 kg)  BMI 27.22 kg/m2       Objective:   Physical Exam  Constitutional: He is oriented to person, place, and time. He appears well-developed and well-nourished.  Cardiovascular: Normal rate, regular rhythm and normal heart sounds.   Pulmonary/Chest: Effort normal and breath sounds normal.  Musculoskeletal:       No joint swelling of bilateral knees or ankles. Normal sensation to temperature and vibration of both feet.  Neurological: He is alert and oriented to person, place, and time.  Skin: Skin is warm and dry. No rash noted.  Psychiatric: He has a normal mood and affect. His behavior is normal.          Assessment & Plan:

## 2011-04-07 NOTE — Patient Instructions (Addendum)
Take aleve twice daily for 1-2 weeks (Take medication with food and plenty of water) Our office will contact you re: blood test results

## 2011-04-07 NOTE — Assessment & Plan Note (Addendum)
Bilateral foot and knee pain of unclear etiology. Considering symmetric nature of her symptoms obtain ANA, rheumatoid factor, sedimentation rate. Consider rheumatology referral. There is no evidence of neuropathy on exam. Use aleve bid for now.

## 2011-04-08 LAB — HIV ANTIBODY (ROUTINE TESTING W REFLEX): HIV: NONREACTIVE

## 2011-04-10 LAB — ANA: Anti Nuclear Antibody(ANA): NEGATIVE

## 2011-04-28 ENCOUNTER — Ambulatory Visit (INDEPENDENT_AMBULATORY_CARE_PROVIDER_SITE_OTHER): Payer: 59 | Admitting: Internal Medicine

## 2011-04-28 ENCOUNTER — Encounter: Payer: Self-pay | Admitting: Internal Medicine

## 2011-04-28 VITALS — BP 130/80 | HR 84 | Temp 98.1°F | Wt 177.0 lb

## 2011-04-28 DIAGNOSIS — N529 Male erectile dysfunction, unspecified: Secondary | ICD-10-CM | POA: Insufficient documentation

## 2011-04-28 DIAGNOSIS — M2550 Pain in unspecified joint: Secondary | ICD-10-CM | POA: Insufficient documentation

## 2011-04-28 LAB — IBC PANEL
Saturation Ratios: 18.3 % — ABNORMAL LOW (ref 20.0–50.0)
Transferrin: 258.1 mg/dL (ref 212.0–360.0)

## 2011-04-28 LAB — FERRITIN: Ferritin: 72.5 ng/mL (ref 22.0–322.0)

## 2011-04-28 NOTE — Assessment & Plan Note (Signed)
Bilateral arthralgia in 41 y/o male of unclear etiology.  Question post infectious arthritis vs other systemic cause.  Initial lab data unrevealing.  Wife notes ED and low libido.  Consider hemachromatosis. Check iron studies and ferritin level. Refer to rheumatology for further workup.

## 2011-04-28 NOTE — Assessment & Plan Note (Signed)
Check a free and total testosterone level.

## 2011-04-28 NOTE — Patient Instructions (Signed)
Our office will contact you re: rheumatology referral 

## 2011-04-28 NOTE — Progress Notes (Signed)
Subjective:    Patient ID: Francisco Juarez, male    DOB: 04-21-1970, 41 y.o.   MRN: [id number]  HPI  41 year old Hispanic male followup regarding polyarthralgias. Patient reports his symptoms are getting progressively worse. Now he is having intermittent shoulder pain and elbow pain. Left greater than right. When he gets up in the morning he feels like his feet are swollen. He has a sensation that he is carrying something very heavy all throughout the day. He has been unable to work for the last several days due to his progressive symptoms. He is accompanied by his wife today. She notes he has intermittent fevers at night. She also notes lack of libido and issues with erectile dysfunction.  His blood tests showed negative ANA and rheumatoid factor. His electrolytes and kidney function were normal. CBC was unremarkable. LFTs were normal. Uric acid was mildly elevated at 8.3. Sedimentation rate was 11. RPR and HIV antibody were negative.  Review of Systems Low libido, erectile dysfunction No hx of flu like illness within the last 6 months   Past Medical History  Diagnosis Date  . Hyperlipidemia   . Hypertension     History   Social History  . Marital Status: Married    Spouse Name: N/A    Number of Children: N/A  . Years of Education: N/A   Occupational History  . Not on file.   Social History Main Topics  . Smoking status: Former Games developer  . Smokeless tobacco: Not on file   Comment: quit April 2006, smoked 1 1/2 pack/day  x 10 years  . Alcohol Use: No  . Drug Use: No  . Sexually Active: Not on file   Other Topics Concern  . Not on file   Social History Narrative   Reviewed history from 01/18/2007 and no changes required:      Married      Former Smoker -  quit April 2006.  smoked 1 1/2 pack/day x 10 years      Alcohol use-no      Drug use-no      Occupation:  Laboratory clinical tech     Past Surgical History  Procedure Date  . Knee surgery     left knee ligaments repair    . Anterior cruciate ligament repair     left knee  . Foot surgery     screw right foot 5th metatarsal  . 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 Appendectomy     Family History  Problem Relation Age of Onset  . Hypertension    . Diabetes    . Coronary artery disease      Allergies not on file  Current Outpatient Prescriptions on File Prior to Visit  Medication Sig Dispense Refill  . zolpidem (AMBIEN) 10 MG tablet Take 1 tablet (10 mg total) by mouth at bedtime as needed.  90 tablet  1    BP 152/100  Pulse 84  Temp(Src) 98.1 F (36.7 C) (Oral)  Wt 177 lb (80.287 kg)       Objective:   Physical Exam  Constitutional: He is oriented to person, place, and time. He appears well-developed and well-nourished.  HENT:  Head: Normocephalic and atraumatic.  Mouth/Throat: Oropharynx is clear and moist.  Neck: Neck supple.  Cardiovascular: Normal rate and normal heart sounds.   Pulmonary/Chest: Effort normal and breath sounds normal. He has no wheezes. He has no rales.  Abdominal: Soft. Bowel sounds are normal.  Musculoskeletal:       No joint redness or swelling  Lymphadenopathy:    He has no cervical adenopathy.  Neurological: He is alert and oriented to person, place, and time. He has normal reflexes. He exhibits normal muscle tone.       Able to sense temperature and vibration in his lower extremities  Skin: Skin is warm and dry.  Psychiatric: He has a normal mood and affect. His behavior is normal.       Assessment & Plan:

## 2011-04-29 LAB — HEPATITIS B SURFACE ANTIBODY,QUALITATIVE: Hep B S Ab: POSITIVE — AB

## 2011-04-29 LAB — HEPATITIS B SURFACE ANTIGEN: Hepatitis B Surface Ag: NEGATIVE

## 2011-04-29 LAB — HEPATITIS C ANTIBODY: HCV Ab: NEGATIVE

## 2011-05-02 ENCOUNTER — Telehealth: Payer: Self-pay | Admitting: *Deleted

## 2011-05-02 LAB — TESTOSTERONE, FREE, TOTAL, SHBG

## 2011-05-02 NOTE — Telephone Encounter (Signed)
Pt has an appt with rheumatologist next Tuesday and he was wondering if Dr Artist Pais could give him something for his pain in knees and legs until he see them

## 2011-05-03 MED ORDER — TRAMADOL HCL 50 MG PO TABS: 50.0000 mg | ORAL_TABLET | Freq: Four times a day (QID) | ORAL | Status: DC | PRN

## 2011-05-03 NOTE — Telephone Encounter (Signed)
rx called in, pt aware 

## 2011-05-03 NOTE — Telephone Encounter (Signed)
Please call in tramadol 50 mg one po bid prn  # 30 RF x 1

## 2011-05-09 ENCOUNTER — Telehealth: Payer: Self-pay | Admitting: *Deleted

## 2011-05-09 NOTE — Telephone Encounter (Signed)
Tramadol is making pt nauseated.  He wants to know if Dr Artist Pais could give him something else for pain or something for the nausea

## 2011-05-10 MED ORDER — HYDROCODONE-ACETAMINOPHEN 5-500 MG PO TABS: 1.0000 | ORAL_TABLET | Freq: Two times a day (BID) | ORAL | Status: AC | PRN

## 2011-05-10 NOTE — Telephone Encounter (Signed)
Call in generic vicodin 5/500 one po bid prn  # 30 RF x 1

## 2011-05-10 NOTE — Telephone Encounter (Signed)
rx called in, told pt to d/c the tramadol and cancelled the remaining refills.  Pt aware

## 2011-05-15 ENCOUNTER — Telehealth: Payer: Self-pay | Admitting: Internal Medicine

## 2011-05-15 NOTE — Telephone Encounter (Signed)
[employer] called re: pts disability. Rcvd form back, but some items were not completed. Need dx of pt and how long doctor is keeping pt out of work. Pls call.

## 2011-05-16 ENCOUNTER — Other Ambulatory Visit: Payer: Self-pay | Admitting: *Deleted

## 2011-05-16 NOTE — Telephone Encounter (Signed)
Forms are on your desk.

## 2011-05-19 ENCOUNTER — Telehealth: Payer: Self-pay | Admitting: Internal Medicine

## 2011-05-19 NOTE — Telephone Encounter (Signed)
Form completed.

## 2011-05-19 NOTE — Telephone Encounter (Signed)
Spoke with wife

## 2011-05-19 NOTE — Telephone Encounter (Signed)
Testosterone level not low enough for pt to need replacement

## 2011-05-19 NOTE — Telephone Encounter (Addendum)
Per wife patient testosterone level is 385. Pt needs something for low t. Pt wife is requesting pills no gel. [phone]. Pt saw rheumatologist and according to wife rheum mention testosterone was low. Please advise

## 2011-05-25 ENCOUNTER — Ambulatory Visit (INDEPENDENT_AMBULATORY_CARE_PROVIDER_SITE_OTHER): Payer: 59 | Admitting: Internal Medicine

## 2011-05-25 ENCOUNTER — Encounter: Payer: Self-pay | Admitting: Internal Medicine

## 2011-05-25 VITALS — BP 140/90 | Temp 97.9°F | Wt 183.0 lb

## 2011-05-25 DIAGNOSIS — M2550 Pain in unspecified joint: Secondary | ICD-10-CM

## 2011-05-25 NOTE — Patient Instructions (Signed)
Cymbalta 30 mg daily for 2 weeks followed by 60 mg daily Followup with Dr. Artist Pais  as scheduled on June 24  Call or return to clinic prn if these symptoms worsen

## 2011-05-25 NOTE — Progress Notes (Signed)
  Subjective:    Patient ID: Francisco Juarez, male    DOB: [birth year], 41 y.o.   MRN: [id number]  HPI  41 year old patient who is seen today for followup of fairly generalized arthralgias. This has been present for almost 2 months. His chief complaint is bilateral shoulder pain that is worsening but he also has pain in the knees elbows and feet. He has had a fairly complete laboratory screen and also a rheumatology evaluation. Presently he is on Vicodin for pain only. He has not benefited from anti-inflammatory medications and tramadol was associated with nausea.     Review of Systems  Constitutional: Negative for fever, chills, appetite change and fatigue.  HENT: Negative for hearing loss, ear pain, congestion, sore throat, trouble swallowing, neck stiffness, dental problem, voice change and tinnitus.   Eyes: Negative for pain, discharge and visual disturbance.  Respiratory: Negative for cough, chest tightness, wheezing and stridor.   Cardiovascular: Negative for chest pain, palpitations and leg swelling.  Gastrointestinal: Negative for nausea, vomiting, abdominal pain, diarrhea, constipation, blood in stool and abdominal distention.  Genitourinary: Negative for urgency, hematuria, flank pain, discharge, difficulty urinating and genital sores.  Musculoskeletal: Positive for arthralgias. Negative for myalgias, back pain, joint swelling and gait problem.  Skin: Negative for rash.  Neurological: Positive for tremors (describes a single episode of tremor involving both hands it lasted 60 seconds). Negative for dizziness, syncope, speech difficulty, weakness, numbness and headaches.  Hematological: Negative for adenopathy. Does not bruise/bleed easily.  Psychiatric/Behavioral: Negative for behavioral problems and dysphoric mood. The patient is not nervous/anxious.        Objective:   Physical Exam  Constitutional: He appears well-developed and well-nourished. No distress.  Musculoskeletal: Normal  range of motion. He exhibits no edema and no tenderness.       No signs of active synovitis  Psychiatric: He has a normal mood and affect. His behavior is normal.       Describes periods of lack of focus          Assessment & Plan:   Arthralgias with predominant bilateral shoulder pain. We'll give a trial of Cymbalta 30 mg daily for 2 weeks followed by 60 mg. Samples were provided he is scheduled for followup on June 24. We'll continue Vicodin

## 2011-05-26 ENCOUNTER — Telehealth: Payer: Self-pay | Admitting: Internal Medicine

## 2011-05-26 NOTE — Telephone Encounter (Signed)
Pts spouse called and said that pt lft 2 vms for Dr Artist Pais nurse this wk. Pt ended up seeing Dr Amador Cunas re: Chronic Pain.  Pt is still out of work and is on short term disability. Need to know pts return date for work per [employer] so pts claim can be processed. Pls call asap today. Also be sure to call [employer] at [phone] and speak to any case manager that answers.

## 2011-05-26 NOTE — Telephone Encounter (Addendum)
[name] and they needed additional information.  They faxed forms over and the forms were faxed back, pt aware, he has a f/u with Dr Artist Pais on 06/26/11 and he is wondering if he still needs to see the rheumatologist.

## 2011-06-01 NOTE — Telephone Encounter (Signed)
Form completed today.  I suggest he still see the rheumatologist as we still don't have conclusive diagnosis.

## 2011-06-01 NOTE — Telephone Encounter (Signed)
Pt aware.

## 2011-06-06 ENCOUNTER — Ambulatory Visit (INDEPENDENT_AMBULATORY_CARE_PROVIDER_SITE_OTHER): Payer: 59 | Admitting: Internal Medicine

## 2011-06-06 VITALS — BP 134/108 | HR 96 | Temp 98.0°F | Wt 178.0 lb

## 2011-06-06 DIAGNOSIS — M2550 Pain in unspecified joint: Secondary | ICD-10-CM

## 2011-06-06 MED ORDER — DULOXETINE HCL 60 MG PO CPEP: 60.0000 mg | ORAL_CAPSULE | Freq: Every day | ORAL | Status: DC

## 2011-06-06 MED ORDER — AZITHROMYCIN 250 MG PO TABS: ORAL_TABLET | ORAL | Status: AC

## 2011-06-06 NOTE — Progress Notes (Signed)
Subjective:    Patient ID: Francisco Juarez, male    DOB: 14-Jul-1970, 41 y.o.   MRN: [id number]  HPI  41 year old male previously seen for unexplained lower extremity pains for followup. Since previous visit the patient was seen by rheumatology. Patient containes continues to have unexplained lower extremity pain. He also complains of bilateral shoulder and elbow pain. He describes cramping sensation in his hands and weakness in his shoulders. His legs feel particularly weak going down steps and he has pain in his legs when going up steps. He also describes periodic twitching of his hands.  He is not able sustain more than 1 -2 hr of sustained activity due to pain and weakness.  Workup for inflammatory arthritis was negative. Rheumatology added CCP antibody which was negative. Patient reports prior to his musculoskeletal complaints he experienced diarrhea for 6 months. He has intermittent abdominal bloating and heartburn. He continues to have loose stools.  He was also seen by one of my partners since previous visit. He was started on Cymbalta in the hopes that it may help his musculoskeletal complaints. Patient also complains of depressive symptoms. He has been on 30 mg for the last 2 weeks. Has been mild improvement in depressive symptoms but no significant change in musculoskeletal complaints.  Review of Systems See HPI  Past Medical History  Diagnosis Date  . Hyperlipidemia   . Hypertension     History   Social History  . Marital Status: Married    Spouse Name: N/A    Number of Children: N/A  . Years of Education: N/A   Occupational History  . Not on file.   Social History Main Topics  . Smoking status: Former Games developer  . Smokeless tobacco: Not on file   Comment: quit April 2006, smoked 1 1/2 pack/day  x 10 years  . Alcohol Use: No  . Drug Use: No  . Sexually Active: Not on file   Other Topics Concern  . Not on file   Social History Narrative   Reviewed history from  01/18/2007 and no changes required:      Married      Former Smoker -  quit April 2006.  smoked 1 1/2 pack/day x 10 years      Alcohol use-no      Drug use-no      Occupation:  Laboratory clinical tech     Past Surgical History  Procedure Date  . Knee surgery     left knee ligaments repair  . Anterior cruciate ligament repair     left knee  . Foot surgery     screw right foot 5th metatarsal  . 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 Appendectomy     Family History  Problem Relation Age of Onset  . Hypertension    . Diabetes    . Coronary artery disease      No Known Allergies  Current Outpatient Prescriptions on File Prior to Visit  Medication Sig Dispense Refill  . DULoxetine (CYMBALTA) 60 MG capsule Take 1 capsule (60 mg total) by mouth daily.  30 capsule  1  . HYDROcodone-acetaminophen (VICODIN) 5-500 MG per tablet Take 1 tablet by mouth 2 (two) times daily as needed.      . zolpidem (AMBIEN) 10 MG tablet Take 1 tablet (10 mg total) by mouth at bedtime as needed.  90 tablet  1    BP 134/108  Pulse 96  Temp(Src) 98 F (36.7  C) (Oral)  Wt 178 lb (80.74 kg)       Objective:   Physical Exam  Constitutional: He appears well-developed and well-nourished.  HENT:  Head: Normocephalic and atraumatic.  Cardiovascular: Normal rate, regular rhythm and normal heart sounds.   Pulmonary/Chest: Effort normal and breath sounds normal. He has no wheezes.  Abdominal: Soft. Bowel sounds are normal. He exhibits no mass. There is no tenderness.  Musculoskeletal:       Mild weakness of bilateral deltoids  Neurological: He exhibits normal muscle tone.       Upper extremity reflexes are normal. +3 patellar reflexes bilaterally  able to sense temperature and vibration without difficulty  Psych - slight flat affect        Assessment & Plan:

## 2011-06-06 NOTE — Assessment & Plan Note (Signed)
40 year old male continues to have unexplained upper and lower extremity pain and weakness. Rheumatologic workup so far has been negative. He reports preceding diarrhea prior to his musculoskeletal complaints. Consider postinfectious arthralgia. Consider Campylobacter as potential etiology. Empiric treatment with azithromycin 500 mg x7 days. We also discussed possible post viral process.  Treatment options limited.  Consider pain management referral.

## 2011-06-19 ENCOUNTER — Other Ambulatory Visit: Payer: 59

## 2011-06-21 ENCOUNTER — Ambulatory Visit (INDEPENDENT_AMBULATORY_CARE_PROVIDER_SITE_OTHER): Payer: 59 | Admitting: Internal Medicine

## 2011-06-21 ENCOUNTER — Encounter: Payer: Self-pay | Admitting: Internal Medicine

## 2011-06-21 VITALS — BP 130/98 | HR 92 | Temp 98.3°F | Wt 175.0 lb

## 2011-06-21 DIAGNOSIS — M797 Fibromyalgia: Secondary | ICD-10-CM | POA: Insufficient documentation

## 2011-06-21 DIAGNOSIS — M791 Myalgia, unspecified site: Secondary | ICD-10-CM

## 2011-06-21 DIAGNOSIS — IMO0001 Reserved for inherently not codable concepts without codable children: Secondary | ICD-10-CM

## 2011-06-21 DIAGNOSIS — M2550 Pain in unspecified joint: Secondary | ICD-10-CM

## 2011-06-21 NOTE — Assessment & Plan Note (Signed)
Continue cymbalta 60mg

## 2011-06-21 NOTE — Assessment & Plan Note (Signed)
Arthralgia symptoms improved with course of azithromycin and cymbalta.  Question reactive arthritis.  However, patient experiencing persistent muscle aches and exercise intolerance.  Previous sed rate was normal.  Check CPK and serum aldolase.

## 2011-06-21 NOTE — Progress Notes (Signed)
  Subjective:    Patient ID: Francisco Juarez, male    DOB: 06/22/1970, 41 y.o.   MRN: [id number]  HPI  41 year old Hispanic male previously seen for unexplained arthralgias for followup. Patient was prescribed a course of azithromycin to do potential for reactive arthritis. He had diarrhea symptoms prior to his joint symptomatology. Patient's Cymbalta was also increased to 60 mg.  Patient reports joint pains have significantly improved. However he still has muscle aches mainly in his shoulders and thighs. He has poor exercise tolerance. He is unable to mow his yard without significant pain and weakness.  Review of Systems  Depressive symptoms improved  Past Medical History  Diagnosis Date  . Hyperlipidemia   . Hypertension     History   Social History  . Marital Status: Married    Spouse Name: N/A    Number of Children: N/A  . Years of Education: N/A   Occupational History  . Not on file.   Social History Main Topics  . Smoking status: Former Games developer  . Smokeless tobacco: Not on file   Comment: quit April 2006, smoked 1 1/2 pack/day  x 10 years  . Alcohol Use: No  . Drug Use: No  . Sexually Active: Not on file   Other Topics Concern  . Not on file   Social History Narrative   Reviewed history from 01/18/2007 and no changes required:      Married      Former Smoker -  quit April 2006.  smoked 1 1/2 pack/day x 10 years      Alcohol use-no      Drug use-no      Occupation:  Laboratory clinical tech     Past Surgical History  Procedure Date  . Knee surgery     left knee ligaments repair  . Anterior cruciate ligament repair     left knee  . Foot surgery     screw right foot 5th metatarsal  . 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 Appendectomy     Family History  Problem Relation Age of Onset  . Hypertension    . Diabetes    . Coronary artery disease      No Known Allergies  Current Outpatient Prescriptions on  File Prior to Visit  Medication Sig Dispense Refill  . DULoxetine (CYMBALTA) 60 MG capsule Take 1 capsule (60 mg total) by mouth daily.  30 capsule  1  . HYDROcodone-acetaminophen (VICODIN) 5-500 MG per tablet Take 1 tablet by mouth 2 (two) times daily as needed.      . zolpidem (AMBIEN) 10 MG tablet Take 1 tablet (10 mg total) by mouth at bedtime as needed.  90 tablet  1    BP 130/98  Pulse 92  Temp 98.3 F (36.8 C) (Oral)  Wt 175 lb (79.379 kg)       Objective:   Physical Exam  Constitutional: He is oriented to person, place, and time. He appears well-developed and well-nourished.  Cardiovascular: Normal rate, regular rhythm and normal heart sounds.   Pulmonary/Chest: Effort normal and breath sounds normal. He has no wheezes.  Neurological: He is alert and oriented to person, place, and time.  Psychiatric: He has a normal mood and affect. His behavior is normal.          Assessment & Plan:

## 2011-06-23 LAB — ALDOLASE: Aldolase: 7.7 U/L (ref <=8.1)

## 2011-06-26 ENCOUNTER — Ambulatory Visit: Payer: 59 | Admitting: Internal Medicine

## 2011-06-26 ENCOUNTER — Other Ambulatory Visit: Payer: Self-pay | Admitting: Internal Medicine

## 2011-06-26 DIAGNOSIS — M791 Myalgia, unspecified site: Secondary | ICD-10-CM

## 2011-06-26 DIAGNOSIS — M6281 Muscle weakness (generalized): Secondary | ICD-10-CM

## 2011-06-26 DIAGNOSIS — R748 Abnormal levels of other serum enzymes: Secondary | ICD-10-CM

## 2011-07-05 DIAGNOSIS — Z0279 Encounter for issue of other medical certificate: Secondary | ICD-10-CM

## 2011-07-21 ENCOUNTER — Emergency Department (HOSPITAL_COMMUNITY)
Admission: EM | Admit: 2011-07-21 | Discharge: 2011-07-21 | Disposition: A | Discharge status: Home / Self Care | Payer: 59 | Attending: Emergency Medicine | Admitting: Emergency Medicine

## 2011-07-21 ENCOUNTER — Encounter (HOSPITAL_COMMUNITY): Payer: Self-pay | Admitting: *Deleted

## 2011-07-21 DIAGNOSIS — M129 Arthropathy, unspecified: Secondary | ICD-10-CM | POA: Insufficient documentation

## 2011-07-21 DIAGNOSIS — I10 Essential (primary) hypertension: Secondary | ICD-10-CM | POA: Insufficient documentation

## 2011-07-21 DIAGNOSIS — E785 Hyperlipidemia, unspecified: Secondary | ICD-10-CM | POA: Insufficient documentation

## 2011-07-21 DIAGNOSIS — Z87891 Personal history of nicotine dependence: Secondary | ICD-10-CM | POA: Insufficient documentation

## 2011-07-21 DIAGNOSIS — M2550 Pain in unspecified joint: Secondary | ICD-10-CM

## 2011-07-21 MED ORDER — NAPROXEN 375 MG PO TABS: 375.0000 mg | ORAL_TABLET | Freq: Two times a day (BID) | ORAL | Status: DC

## 2011-07-21 MED ORDER — HYDROCODONE-ACETAMINOPHEN 5-500 MG PO TABS: 1.0000 | ORAL_TABLET | Freq: Four times a day (QID) | ORAL | Status: AC | PRN

## 2011-07-21 NOTE — ED Notes (Signed)
Patient reports he has had knee, hip, and foot pain since April but worse x 3 to 4 days.  Patient states he does not have any dx for his pain,  xrays and labs have been normal

## 2011-07-21 NOTE — ED Provider Notes (Signed)
History     CSN: [id number]  Arrival date & time 07/21/11  1157   First MD Initiated Contact with Patient 07/21/11 1256      Chief Complaint  Patient presents with  . Hip Pain  . Knee Pain  . Foot Pain    (Consider location/radiation/quality/duration/timing/severity/associated sxs/prior treatment) Patient is a 41 y.o. male presenting with extremity pain. The history is provided by the patient.  Extremity Pain This is a chronic problem. The current episode started more than 1 month ago. The problem occurs constantly. The problem has been gradually worsening. Associated symptoms include arthralgias, fatigue and myalgias. Pertinent negatives include no abdominal pain, chills, fever, headaches, joint swelling, nausea, numbness, vomiting or weakness.  Pt with chronic arthralgias, has been followed by his doctor and dr. Alba Destine, rheumatology. No diagnosis yet. States pain worsened in the last 3 days, no injury. No fever, chills, no joint swelling. statse pain in feet, ankles, knees, hips, elbows, shoulders. Pt unable to see his doctors until next week. Taking cymbalta with no relief.   Past Medical History  Diagnosis Date  . Hyperlipidemia   . Hypertension     Past Surgical History  Procedure Date  . Knee surgery     left knee ligaments repair  . Anterior cruciate ligament repair     left knee  . Foot surgery     screw right foot 5th metatarsal  . Appendectomy     Family History  Problem Relation Age of Onset  . Hypertension    . Diabetes    . Coronary artery disease      History  Substance Use Topics  . Smoking status: Former Games developer  . Smokeless tobacco: Not on file   Comment: quit April 2006, smoked 1 1/2 pack/day  x 10 years  . Alcohol Use: No      Review of Systems  Constitutional: Positive for fatigue. Negative for fever and chills.  Respiratory: Negative.   Cardiovascular: Negative.   Gastrointestinal: Negative for nausea, vomiting and abdominal pain.    Musculoskeletal: Positive for myalgias and arthralgias. Negative for joint swelling.  Neurological: Negative for weakness, light-headedness, numbness and headaches.    Allergies  Review of patient's allergies indicates no known allergies.  Home Medications   Current Outpatient Rx  Name Route Sig Dispense Refill  . DULOXETINE HCL 60 MG PO CPEP Oral Take 60 mg by mouth at bedtime.    Marland Kitchen ZOLPIDEM TARTRATE 10 MG PO TABS Oral Take 10 mg by mouth at bedtime as needed. For insomnia      BP 157/103  Pulse 89  Temp 98.7 F (37.1 C) (Oral)  Resp 20  SpO2 98%  Physical Exam  Nursing note and vitals reviewed. Constitutional: He is oriented to person, place, and time. He appears well-developed and well-nourished. No distress.  HENT:  Head: Normocephalic.  Eyes: Conjunctivae are normal.  Cardiovascular: Normal rate, regular rhythm and normal heart sounds.   Pulmonary/Chest: Effort normal and breath sounds normal. No respiratory distress. He has no wheezes. He has no rales.  Musculoskeletal:       Ankle, knee, hip, wrist, elbow, shoulder joints examined. All appear normal in color. No swelling noted. Full rom of all joints, just painful. Gait normal.   Neurological: He is alert and oriented to person, place, and time.  Skin: Skin is warm and dry.  Psychiatric: He has a normal mood and affect.    ED Course  Procedures (including critical care time)  Pt with chronic  arthralgias. Exam does not show any signs of infection, no joint swelling, pt afebrile, ambulatory. Pt has had labs done by his PCP which demonstrated elevated CK level of 500, otherwise normal. Pt since has seen a rheumatologist and had more tests done with results of them pending. Will d/c home with pain medications until able to follow up.   1. Arthralgia       MDM          Lottie Mussel, PA 07/21/11 1339

## 2011-07-21 NOTE — ED Notes (Signed)
Patient is ambulatory

## 2011-07-21 NOTE — ED Provider Notes (Signed)
Medical screening examination/treatment/procedure(s) were performed by non-physician practitioner and as supervising physician I was immediately available for consultation/collaboration. Devoria Albe, MD, Armando Gang   Ward Givens, MD 07/21/11 2038

## 2011-07-24 ENCOUNTER — Telehealth: Payer: Self-pay | Admitting: Internal Medicine

## 2011-07-24 NOTE — Telephone Encounter (Signed)
Per Dr Artist Pais no need to see pt until after he sees rheumatology.  Rescheduled pt to 08/03/11 at 3:00

## 2011-07-24 NOTE — Telephone Encounter (Signed)
Caller: Francisco Juarez/Patient; PCP: Allena Earing); CB#: [phone]; Call regarding Appt Question; Asking if should keep appt for 07/25/11 at 1500 with Dr Artist Pais since he does not return to Rheumatologist until 08/01/11.  D/t recent ED visit 07/21/11, advised to keep appt with Dr Artist Pais 07/25/11 at 1500.  Advice provided for other non urgent info for PCP and does not require PCP response per PCP Call, No Triage Guideline.

## 2011-07-25 ENCOUNTER — Ambulatory Visit: Payer: 59 | Admitting: Internal Medicine

## 2011-07-31 ENCOUNTER — Other Ambulatory Visit: Payer: Self-pay | Admitting: *Deleted

## 2011-07-31 MED ORDER — ZOLPIDEM TARTRATE 10 MG PO TABS: 10.0000 mg | ORAL_TABLET | Freq: Every evening | ORAL | Status: DC | PRN

## 2011-08-03 ENCOUNTER — Ambulatory Visit: Payer: 59 | Admitting: Internal Medicine

## 2011-08-03 ENCOUNTER — Telehealth: Payer: Self-pay | Admitting: Internal Medicine

## 2011-08-03 NOTE — Telephone Encounter (Signed)
Caller: Ob[place]wa/Patient; Phone Number: [phone]; Message from caller: Pt calling today 08/03/11 regarding has appt today with Dr.  Artist Pais around 3 PM, and wants to know if MD still wants to see him today, b/c Dr.  Thayer Jew has referred him to have more testing (sleep study and to see a Rheumatologist).  PLEASE CALL PT BACK AT [phone] TO LET HIM KNOW IF MD STILL WANTS TO SEE HIM TODAY OR IF APPT NEEDS TO BE CANCELLED AND RESCHEDULED FOR LATER.

## 2011-08-03 NOTE — Telephone Encounter (Signed)
Spoke with Dr. Artist Pais, and he would like to wait to see Francisco Juarez until his appt with the specialist and the sleep study have been completed. Pt is aware, we canceled today's appt, and he will call to set up a new appt after all appts and tests are finished.

## 2011-08-03 NOTE — Telephone Encounter (Signed)
Please check with Dr. Artist Pais ASAP when he gets here (around 1pm) and let patient know. I will call him to let him know it won't be until this afternoon that we have an answer. Thanks.

## 2011-08-27 ENCOUNTER — Encounter (HOSPITAL_COMMUNITY): Payer: Self-pay | Admitting: *Deleted

## 2011-08-27 ENCOUNTER — Emergency Department (HOSPITAL_COMMUNITY)
Admission: EM | Admit: 2011-08-27 | Discharge: 2011-08-27 | Disposition: A | Discharge status: Home / Self Care | Payer: 59 | Attending: Emergency Medicine | Admitting: Emergency Medicine

## 2011-08-27 DIAGNOSIS — M25519 Pain in unspecified shoulder: Secondary | ICD-10-CM

## 2011-08-27 DIAGNOSIS — I10 Essential (primary) hypertension: Secondary | ICD-10-CM | POA: Insufficient documentation

## 2011-08-27 DIAGNOSIS — G8929 Other chronic pain: Secondary | ICD-10-CM | POA: Insufficient documentation

## 2011-08-27 DIAGNOSIS — E785 Hyperlipidemia, unspecified: Secondary | ICD-10-CM | POA: Insufficient documentation

## 2011-08-27 HISTORY — DX: Pain in left shoulder: M25.512

## 2011-08-27 HISTORY — DX: Other chronic pain: G89.29

## 2011-08-27 MED ORDER — HYDROCODONE-ACETAMINOPHEN 5-500 MG PO TABS: 1.0000 | ORAL_TABLET | Freq: Four times a day (QID) | ORAL | Status: AC | PRN

## 2011-08-27 NOTE — ED Provider Notes (Signed)
History     CSN: [id number]  Arrival date & time 08/27/11  0255   First MD Initiated Contact with Patient 08/27/11 0510      Chief Complaint  Patient presents with  . Shoulder Pain    (Consider location/radiation/quality/duration/timing/severity/associated sxs/prior treatment) HPI  Patient presented to the emergency department with complaints of exacerbation of his chronic shoulder pain. He states that he has been evaluated by many people for this but nobody knows what caused his pain. He is currently seeing a rheumatologist. He is on Robaxin, gabapentin, Cymbalta for the pain but he says that it is not helping tonight. He states he took a nap and began to go to sleep and he has not been to sleep yet. He is requesting just something to help him for right now to get over this pain exacerbation. The patient is in no acute distress and his vital signs are stable.  Past Medical History  Diagnosis Date  . Hyperlipidemia   . Hypertension   . Chronic pain in left shoulder     Past Surgical History  Procedure Date  . Knee surgery     left knee ligaments repair  . Anterior cruciate ligament repair     left knee  . Foot surgery     screw right foot 5th metatarsal  . Appendectomy     Family History  Problem Relation Age of Onset  . Hypertension    . Diabetes    . Coronary artery disease      History  Substance Use Topics  . Smoking status: Former Games developer  . Smokeless tobacco: Not on file   Comment: quit April 2006, smoked 1 1/2 pack/day  x 10 years  . Alcohol Use: No      Review of Systems  All other systems reviewed and are negative.    Allergies  Review of patient's allergies indicates no known allergies.  Home Medications   Current Outpatient Rx  Name Route Sig Dispense Refill  . DULOXETINE HCL 60 MG PO CPEP Oral Take 60 mg by mouth at bedtime.    Marland Kitchen GABAPENTIN 300 MG PO CAPS Oral Take 300 mg by mouth 3 (three) times daily.    Marland Kitchen METHOCARBAMOL 500 MG PO TABS  Oral Take 500 mg by mouth 3 (three) times daily.    Marland Kitchen NAPROXEN 375 MG PO TABS Oral Take 1 tablet (375 mg total) by mouth 2 (two) times daily with a meal. 20 tablet 0  . ZOLPIDEM TARTRATE 10 MG PO TABS Oral Take 1 tablet (10 mg total) by mouth at bedtime as needed. For insomnia 30 tablet 3  . HYDROCODONE-ACETAMINOPHEN 5-500 MG PO TABS Oral Take 1 tablet by mouth every 6 (six) hours as needed for pain. 12 tablet 0    BP 145/101  Pulse 86  Temp 98.2 F (36.8 C) (Oral)  Resp 16  SpO2 99%  Physical Exam  Nursing note and vitals reviewed. Constitutional: He appears well-developed and well-nourished. No distress.  HENT:  Head: Normocephalic and atraumatic.  Eyes: Pupils are equal, round, and reactive to light.  Neck: Normal range of motion. Neck supple.  Cardiovascular: Normal rate and regular rhythm.   Pulmonary/Chest: Effort normal.  Abdominal: Soft.  Musculoskeletal:       Left shoulder: He exhibits pain and spasm. He exhibits normal range of motion, no tenderness, no bony tenderness, no swelling, no effusion, no deformity, no laceration, normal pulse and normal strength.  Neurological: He is alert.  Skin: Skin is  warm and dry.    ED Course  Procedures (including critical care time)  Labs Reviewed - No data to display No results found.   1. Shoulder pain       MDM  Agent did not come to the ER with a driver. Therefore he was given a small prescription of pain medications take to the pharmacy indicating that home for pain. Patient is agreeable with this plan and has agreed to follow-up with his PCP.  Pt has been advised of the symptoms that warrant their return to the ED. Patient has voiced understanding and has agreed to follow-up with the PCP or specialist.         Dorthula Matas, PA 08/27/11 [phone]

## 2011-08-27 NOTE — ED Notes (Signed)
Pt c/o acute exacerbation of chronic L shoulder pain. Pt has taken all normal pain meds rx'd for this problem.

## 2011-08-31 ENCOUNTER — Ambulatory Visit (HOSPITAL_BASED_OUTPATIENT_CLINIC_OR_DEPARTMENT_OTHER): Payer: 59 | Attending: Rheumatology

## 2011-08-31 VITALS — Ht 68.0 in | Wt 175.0 lb

## 2011-08-31 DIAGNOSIS — G4710 Hypersomnia, unspecified: Secondary | ICD-10-CM | POA: Insufficient documentation

## 2011-08-31 DIAGNOSIS — Z79899 Other long term (current) drug therapy: Secondary | ICD-10-CM | POA: Insufficient documentation

## 2011-08-31 DIAGNOSIS — G4730 Sleep apnea, unspecified: Secondary | ICD-10-CM | POA: Insufficient documentation

## 2011-08-31 DIAGNOSIS — G4733 Obstructive sleep apnea (adult) (pediatric): Secondary | ICD-10-CM

## 2011-08-31 NOTE — ED Provider Notes (Signed)
Medical screening examination/treatment/procedure(s) were performed by non-physician practitioner and as supervising physician I was immediately available for consultation/collaboration.  Ericka Marcellus, MD 08/31/11 2113 

## 2011-09-02 DIAGNOSIS — R0989 Other specified symptoms and signs involving the circulatory and respiratory systems: Secondary | ICD-10-CM

## 2011-09-02 DIAGNOSIS — R0609 Other forms of dyspnea: Secondary | ICD-10-CM

## 2011-09-02 DIAGNOSIS — G4730 Sleep apnea, unspecified: Secondary | ICD-10-CM

## 2011-09-02 DIAGNOSIS — G4710 Hypersomnia, unspecified: Secondary | ICD-10-CM

## 2011-09-03 NOTE — Procedures (Signed)
NAME:  Francisco Juarez, [place] NO.:  [id number]  MEDICAL RECORD NO.:  [id number]          PATIENT TYPE:  OUT  LOCATION:  SLEEP CENTER                 FACILITY[facility]  [facility]  PHYSICIAN:  Clinton D. Maple Hudson, MD, FCCP, FACPDATE OF BIRTH:  08[birth year]09[birth year]1971  DATE OF STUDY:  08/31/2011                           NOCTURNAL POLYSOMNOGRAM  REFERRING PHYSICIAN:  Pollyann Savoy, M.D.  INDICATION FOR STUDY:  Hypersomnia with sleep apnea.  EPWORTH SLEEPINESS SCORE:  12/24.  BMI 27, weight 175 pounds, height 68 inches, neck 17 inches.  MEDICATIONS:  Home medications are charted and reviewed.  SLEEP ARCHITECTURE:  Total sleep time 265.5 minutes with sleep efficiency 70.7%.  Stage I was 18.6%, stage II 81.4%, stages III and REM were absent.  Sleep latency 52.5 minutes, awake after sleep onset 49 minutes.  Arousal index 9.  Bedtime Medication:  Ambien 10 mg.  RESPIRATORY DATA:  Apnea-hypopnea index (AHI) 2.9 per hour.  A total of 13 events was scored including 3 central apneas and 10 hypopneas. Events were associated with supine sleep position.  This is a diagnostic NPSG protocol.  OXYGEN DATA:  Moderately loud snoring with oxygen desaturation to a nadir of 86% and mean oxygen saturation through the study of 95.3% on room air.  CARDIAC DATA:  Normal sinus rhythm.  MOVEMENT/PARASOMNIA:  No significant movement disturbance.  No bathroom trips.  IMPRESSION-RECOMMENDATIONS: 1. Sleep was fragmented by frequent nonspecific brief awakenings     despite Ambien.  Consider managing as insomnia. 2. Occasional respiratory event with sleep disturbance, within normal     limits.  AHI 2.9 per hour (the normal range for adults is between 0     and 5 events per hour). 3. Moderately loud snoring with oxygen desaturation to a nadir of 86%     and mean oxygen saturation through the study of 95.3% on room air.     Clinton D. Maple Hudson, MD, [facility], FACP Diplomate, American Board of Sleep  Medicine    CDY/MEDQ  D:  09/03/2011 10:02:40  T:  09/03/2011 22:02:40  Job:  [id number]

## 2011-09-07 DIAGNOSIS — Z0279 Encounter for issue of other medical certificate: Secondary | ICD-10-CM

## 2011-11-09 ENCOUNTER — Encounter: Payer: Self-pay | Admitting: Internal Medicine

## 2011-11-09 ENCOUNTER — Ambulatory Visit (INDEPENDENT_AMBULATORY_CARE_PROVIDER_SITE_OTHER): Payer: Self-pay | Admitting: Internal Medicine

## 2011-11-09 VITALS — BP 168/108 | HR 88 | Temp 97.6°F | Wt 186.0 lb

## 2011-11-09 DIAGNOSIS — IMO0001 Reserved for inherently not codable concepts without codable children: Secondary | ICD-10-CM

## 2011-11-09 DIAGNOSIS — M797 Fibromyalgia: Secondary | ICD-10-CM

## 2011-11-09 MED ORDER — ZOLPIDEM TARTRATE 10 MG PO TABS: 10.0000 mg | ORAL_TABLET | Freq: Every evening | ORAL | Status: DC | PRN

## 2011-11-09 NOTE — Assessment & Plan Note (Signed)
Patient seen by rheumatologist in [place] and in [place]. They both confirmed diagnosis of fibromyalgia. Continue Cymbalta and gabapentin. Patient's activity is significantly limited due to chronic pain.  Trial of adding OTC Meriva curcumin supplement.  Reassess in 2 months.

## 2011-11-09 NOTE — Progress Notes (Signed)
Subjective:    Patient ID: Francisco Juarez, male    DOB: Jan 28, 1970, 41 y.o.   MRN: [id number]  HPI  41 year old male for followup regarding fibromyalgia. Patient seen by rheumatologist at [facility]. She repeated multiple blood tests included patient's symptoms secondary to fibromyalgia. Patient is maintained on Cymbalta and gabapentin.  Patient still struggling with most activities of daily living. Patient has had to switch cars with his wife because he is no longer drive manual shift. Patient also unable to mow his grass with push mower. He is now using riding mower. There are weeks when patient does very little and stays in the bed for most of the day.  Review of Systems Negative for weight gain. Patient reports he monitors blood pressure at home. Home blood pressure readings are normal.  Past Medical History  Diagnosis Date  . Hyperlipidemia   . Hypertension   . Chronic pain in left shoulder     History   Social History  . Marital Status: Married    Spouse Name: N/A    Number of Children: N/A  . Years of Education: N/A   Occupational History  . Not on file.   Social History Main Topics  . Smoking status: Former Games developer  . Smokeless tobacco: Not on file     Comment: quit April 2006, smoked 1 1/2 pack/day  x 10 years  . Alcohol Use: No  . Drug Use: No  . Sexually Active: Yes    Birth Control/ Protection: None   Other Topics Concern  . Not on file   Social History Narrative   Reviewed history from 01/18/2007 and no changes required:      Married      Former Smoker -  quit April 2006.  smoked 1 1/2 pack/day x 10 years      Alcohol use-no      Drug use-no      Occupation:  Laboratory clinical tech     Past Surgical History  Procedure Date  . Knee surgery     left knee ligaments repair  . Anterior cruciate ligament repair     left knee  . Foot surgery     screw right foot 5th metatarsal  . Appendectomy     Family History  Problem Relation Age of Onset  .  Hypertension    . Diabetes    . Coronary artery disease      No Known Allergies  Current Outpatient Prescriptions on File Prior to Visit  Medication Sig Dispense Refill  . DULoxetine (CYMBALTA) 60 MG capsule Take 60 mg by mouth at bedtime.      . gabapentin (NEURONTIN) 300 MG capsule Take 300 mg by mouth 3 (three) times daily.      . methocarbamol (ROBAXIN) 500 MG tablet Take 500 mg by mouth 3 (three) times daily.        BP 168/108  Pulse 88  Temp 97.6 F (36.4 C) (Oral)  Wt 186 lb (84.369 kg)       Objective:   Physical Exam  Constitutional: He is oriented to person, place, and time. He appears well-developed and well-nourished.  Cardiovascular: Normal rate, regular rhythm and normal heart sounds.   No murmur heard. Pulmonary/Chest: Effort normal. He has wheezes.  Neurological: He is alert and oriented to person, place, and time. No cranial nerve deficit.       Upper and lower extremity muscle strength is normal          Assessment &  Plan:

## 2011-11-16 ENCOUNTER — Telehealth: Payer: Self-pay | Admitting: Family Medicine

## 2011-11-16 MED ORDER — HYDROCODONE-ACETAMINOPHEN 5-500 MG PO CAPS: 1.0000 | ORAL_CAPSULE | Freq: Two times a day (BID) | ORAL | Status: DC | PRN

## 2011-11-16 NOTE — Telephone Encounter (Signed)
Pt aware, rx called in  

## 2011-11-16 NOTE — Telephone Encounter (Signed)
I would not recommend pt use narcotics long term for treatment of fibromyalgia pain.  I suggest he try tramadol 50 one po q 12 hrs prn.  # 60.  No refills.  Patient should use tramadol sparingly.

## 2011-11-16 NOTE — Telephone Encounter (Signed)
Patient is requesting a rx for Oxycodone. He thought he was on it before, but I only see hydrocodone. Please advise - pt states the it is for fibromyalgia. Thanks.

## 2011-11-16 NOTE — Telephone Encounter (Signed)
Tramadol made him sick last time.  Pt does not want to take that

## 2011-11-16 NOTE — Telephone Encounter (Signed)
Ok for generic vicodin 5/500 mg # 45  One po bid prn  No RF (same advice - take sparingly)

## 2012-01-09 ENCOUNTER — Ambulatory Visit: Payer: Self-pay | Admitting: Internal Medicine

## 2012-01-24 ENCOUNTER — Telehealth: Payer: Self-pay | Admitting: Internal Medicine

## 2012-01-24 NOTE — Telephone Encounter (Signed)
Pt wife states pt needs letter stating when he can return to work. Steward Drone: [phone] or  [phone]

## 2012-01-26 NOTE — Telephone Encounter (Signed)
Pt was last seen in Nov, 2013.  I suggest OV so we document clinical improvement.

## 2012-01-26 NOTE — Telephone Encounter (Signed)
Please schedule OV.  

## 2012-02-02 ENCOUNTER — Encounter (HOSPITAL_COMMUNITY): Payer: Self-pay | Admitting: Emergency Medicine

## 2012-02-02 ENCOUNTER — Emergency Department (HOSPITAL_COMMUNITY): Payer: Self-pay

## 2012-02-02 ENCOUNTER — Emergency Department (HOSPITAL_COMMUNITY)
Admission: EM | Admit: 2012-02-02 | Discharge: 2012-02-02 | Disposition: A | Discharge status: Home / Self Care | Payer: Self-pay | Attending: Emergency Medicine | Admitting: Emergency Medicine

## 2012-02-02 DIAGNOSIS — M797 Fibromyalgia: Secondary | ICD-10-CM

## 2012-02-02 DIAGNOSIS — M79609 Pain in unspecified limb: Secondary | ICD-10-CM | POA: Insufficient documentation

## 2012-02-02 DIAGNOSIS — Y939 Activity, unspecified: Secondary | ICD-10-CM | POA: Insufficient documentation

## 2012-02-02 DIAGNOSIS — Y929 Unspecified place or not applicable: Secondary | ICD-10-CM | POA: Insufficient documentation

## 2012-02-02 DIAGNOSIS — G8929 Other chronic pain: Secondary | ICD-10-CM | POA: Insufficient documentation

## 2012-02-02 DIAGNOSIS — W19XXXA Unspecified fall, initial encounter: Secondary | ICD-10-CM | POA: Insufficient documentation

## 2012-02-02 DIAGNOSIS — E785 Hyperlipidemia, unspecified: Secondary | ICD-10-CM | POA: Insufficient documentation

## 2012-02-02 DIAGNOSIS — I10 Essential (primary) hypertension: Secondary | ICD-10-CM | POA: Insufficient documentation

## 2012-02-02 DIAGNOSIS — Z87891 Personal history of nicotine dependence: Secondary | ICD-10-CM | POA: Insufficient documentation

## 2012-02-02 DIAGNOSIS — M79646 Pain in unspecified finger(s): Secondary | ICD-10-CM

## 2012-02-02 DIAGNOSIS — Z79899 Other long term (current) drug therapy: Secondary | ICD-10-CM | POA: Insufficient documentation

## 2012-02-02 DIAGNOSIS — IMO0001 Reserved for inherently not codable concepts without codable children: Secondary | ICD-10-CM | POA: Insufficient documentation

## 2012-02-02 DIAGNOSIS — M2550 Pain in unspecified joint: Secondary | ICD-10-CM | POA: Insufficient documentation

## 2012-02-02 NOTE — ED Provider Notes (Signed)
History     CSN: [id number]  Arrival date & time 02/02/12  1427   First MD Initiated Contact with Patient 02/02/12 1511      Chief Complaint  Patient presents with  . Hand Pain    (Consider location/radiation/quality/duration/timing/severity/associated sxs/prior treatment) The history is provided by the patient and medical records.    Francisco Juarez is a 42 y.o. male  with a hx of fibromyalgia presents to the Emergency Department complaining of gradual, persistent, progressively worsening Right thumb pain without injury onset 2 weeks ago.  Pt . Associated symptoms include difficulty with sleeping 2/2 pain.  Nothing makes it better and movement and use of the thumb makes it worse.  Pt denies fever, chills, headache, neck pain, back pain, weakness, dizziness, numbness, tingling, syncope.  Pt adds that he thinks he may have fallen 1-2 weeks before the pain began.    Past Medical History  Diagnosis Date  . Hyperlipidemia   . Hypertension   . Chronic pain in left shoulder     Past Surgical History  Procedure Date  . Knee surgery     left knee ligaments repair  . Anterior cruciate ligament repair     left knee  . Foot surgery     screw right foot 5th metatarsal  . Appendectomy     Family History  Problem Relation Age of Onset  . Hypertension    . Diabetes    . Coronary artery disease      History  Substance Use Topics  . Smoking status: Former Games developer  . Smokeless tobacco: Not on file     Comment: quit April 2006, smoked 1 1/2 pack/day  x 10 years  . Alcohol Use: No      Review of Systems  HENT: Negative for neck pain.   Cardiovascular: Negative for chest pain.  Musculoskeletal: Positive for arthralgias. Negative for back pain and joint swelling.  Skin: Negative for wound.  Neurological: Negative for numbness.  All other systems reviewed and are negative.    Allergies  Review of patient's allergies indicates no known allergies.  Home Medications   Current  Outpatient Rx  Name  Route  Sig  Dispense  Refill  . DULOXETINE HCL 60 MG PO CPEP   Oral   Take 60 mg by mouth at bedtime.         Marland Kitchen GABAPENTIN 300 MG PO CAPS   Oral   Take 300 mg by mouth 3 (three) times daily.         Marland Kitchen METHOCARBAMOL 500 MG PO TABS   Oral   Take 500 mg by mouth 3 (three) times daily.         Marland Kitchen ZOLPIDEM TARTRATE 10 MG PO TABS   Oral   Take 1 tablet (10 mg total) by mouth at bedtime as needed. For insomnia   90 tablet   1     BP 154/106  Pulse 90  Temp 98.8 F (37.1 C)  Resp 14  SpO2 100%  Physical Exam  Nursing note and vitals reviewed. Constitutional: He appears well-developed and well-nourished. No distress.  HENT:  Head: Normocephalic and atraumatic.  Eyes: Conjunctivae normal are normal.  Cardiovascular: Normal rate, regular rhythm and intact distal pulses.        Capillary refill < 3 seconds  Pulmonary/Chest: Effort normal and breath sounds normal.  Musculoskeletal: He exhibits tenderness. He exhibits no edema.       ROM: full ROM of all fingers and wrist of  the R hand.   No swelling, ecchymosis or edema No snuffbox tenderness Positive grind test.    Neurological: He is alert. Coordination normal.       Sensation intact to sharp and dull Strength 5/5 in the R fingers and wrist including strong grip strength  Skin: Skin is warm and dry. He is not diaphoretic.    ED Course  Procedures (including critical care time)  Labs Reviewed - No data to display Dg Hand Complete Right  02/02/2012  *RADIOLOGY REPORT*  Clinical Data: Right hand pain at the anterior base of the thumb, no known injury  RIGHT HAND - COMPLETE 3+ VIEW  Comparison: None.  Findings:  The lateral radiograph is degraded due to obliquity.  No fracture or dislocation.  Joint spaces are preserved.  No evidence of chondrocalcinosis.  No definite erosions.  Regional soft tissues are normal.  No radiopaque foreign body.  IMPRESSION: Normal radiographs of the right hand.   Original  Report Authenticated By: Tacey Ruiz, MD      1. Thumb pain   2. Fibromyalgia       MDM  Francisco Juarez presents with atraumatic thumb pain.  Suspect possible arthritis.  Minimal concern for fracture, but as pt is unable to recall when his fall was, will obtain an x-ray.  Pt x-ray without evidence of acute abnormality; no fracture or dislocation.  No evidence of erosions and joint space is preserved.  Will place in a thumb spica and have pt f/u with PCP.  No Rx for pain medication b/c pt takes it on a regular basis and it is prescribed by his PCP.    1. Medications: usual home medications including home prescription for vicodin 2. Treatment: rest, drink plenty of fluids, use the thumb spica brace 3. Follow Up: Please followup with your primary doctor for discussion of your diagnoses and further evaluation after today's visit; follow-up with hand ortho as needed for continued pain.           Dahlia Client Nicholson Starace, PA-C 02/03/12 0002

## 2012-02-02 NOTE — Telephone Encounter (Signed)
Pt will call to sch appt.

## 2012-02-02 NOTE — ED Notes (Signed)
Pt reports r 7/10 thumb pain x2 weeks.  Denies injury.

## 2012-02-03 NOTE — ED Provider Notes (Signed)
Medical screening examination/treatment/procedure(s) were performed by non-physician practitioner and as supervising physician I was immediately available for consultation/collaboration.   Gavin Pound. Oletta Lamas, MD 02/03/12 [phone]

## 2012-03-13 ENCOUNTER — Emergency Department (HOSPITAL_COMMUNITY)
Admission: EM | Admit: 2012-03-13 | Discharge: 2012-03-13 | Disposition: A | Discharge status: Home / Self Care | Payer: Self-pay | Attending: Emergency Medicine | Admitting: Emergency Medicine

## 2012-03-13 ENCOUNTER — Encounter (HOSPITAL_COMMUNITY): Payer: Self-pay | Admitting: Emergency Medicine

## 2012-03-13 ENCOUNTER — Emergency Department (HOSPITAL_COMMUNITY): Payer: Self-pay

## 2012-03-13 DIAGNOSIS — Z87891 Personal history of nicotine dependence: Secondary | ICD-10-CM | POA: Insufficient documentation

## 2012-03-13 DIAGNOSIS — M25549 Pain in joints of unspecified hand: Secondary | ICD-10-CM | POA: Insufficient documentation

## 2012-03-13 DIAGNOSIS — I10 Essential (primary) hypertension: Secondary | ICD-10-CM | POA: Insufficient documentation

## 2012-03-13 DIAGNOSIS — G8929 Other chronic pain: Secondary | ICD-10-CM | POA: Insufficient documentation

## 2012-03-13 DIAGNOSIS — E785 Hyperlipidemia, unspecified: Secondary | ICD-10-CM | POA: Insufficient documentation

## 2012-03-13 DIAGNOSIS — Z8739 Personal history of other diseases of the musculoskeletal system and connective tissue: Secondary | ICD-10-CM | POA: Insufficient documentation

## 2012-03-13 MED ORDER — IBUPROFEN 800 MG PO TABS
800.0000 mg | ORAL_TABLET | Freq: Once | ORAL | Status: AC
  Administered 2012-03-13: 800 mg via ORAL
  Filled 2012-03-13: qty 1

## 2012-03-13 NOTE — ED Provider Notes (Signed)
History    This chart was scribed for Glade Nurse, PA-C a non-physician practitioner working with Flint Melter, MD by Lewanda Rife, ED Scribe. This patient was seen in room WTR5/WTR5 and the patient's care was started at 1857.     CSN: [id number]  Arrival date & time 03/13/12  1636   First MD Initiated Contact with Patient 03/13/12 1856      Chief Complaint  Patient presents with  . Hand Pain    (Consider location/radiation/quality/duration/timing/severity/associated sxs/prior treatment) HPI Francisco Juarez is a 42 y.o. male who presents to the Emergency Department complaining of moderate unchanged constant right thumb pain onset for several weeks. Pt denies any recent injury, and fever. Pt reports subjective reduced strength in right thumb due to pain. Pt reports taking Aleve with no relief of symptoms. Pt reports hx of fibromyalgia. Pt reports taking Vicodin at home for chronic pain.   Nothing makes it better and movement and use of the thumb makes it worse. Pt denies fever, chills, headache, neck pain, back pain, weakness, dizziness, numbness, tingling, syncope. Pt adds that he thinks he may have fallen 1-2 weeks before the pain began, but has not fallen since he was seen on 02/02/12 for same complaint. States he did not wear thumb spica as recommended and followed up with no one.   Past Medical History  Diagnosis Date  . Hyperlipidemia   . Hypertension   . Chronic pain in left shoulder     Past Surgical History  Procedure Laterality Date  . Knee surgery      left knee ligaments repair  . Anterior cruciate ligament repair      left knee  . Foot surgery      screw right foot 5th metatarsal  . Appendectomy      Family History  Problem Relation Age of Onset  . Hypertension    . Diabetes    . Coronary artery disease      History  Substance Use Topics  . Smoking status: Former Games developer  . Smokeless tobacco: Not on file     Comment: quit April 2006, smoked 1 1/2  pack/day  x 10 years  . Alcohol Use: No      Review of Systems  Constitutional: Negative.  Negative for fever and diaphoresis.  HENT: Negative.  Negative for neck pain and neck stiffness.   Eyes: Negative for visual disturbance.  Respiratory: Negative.  Negative for apnea, chest tightness and shortness of breath.   Cardiovascular: Negative.  Negative for chest pain and palpitations.  Gastrointestinal: Negative.  Negative for nausea, vomiting, diarrhea and constipation.  Musculoskeletal: Positive for myalgias. Negative for gait problem.       Right thumb pain   Skin: Negative for color change and rash.  Allergic/Immunologic: Negative for immunocompromised state.  Neurological: Negative.  Negative for dizziness, weakness, light-headedness, numbness and headaches.  Hematological: Negative.   Psychiatric/Behavioral: Negative.   All other systems reviewed and are negative.   A complete 10 system review of systems was obtained and all systems are negative except as noted in the HPI and PMH.    Allergies  Review of patient's allergies indicates no known allergies.  Home Medications   Current Outpatient Rx  Name  Route  Sig  Dispense  Refill  . zolpidem (AMBIEN) 10 MG tablet   Oral   Take 1 tablet (10 mg total) by mouth at bedtime as needed. For insomnia   90 tablet   1  BP 130/96  Pulse 87  Temp(Src) 98.4 F (36.9 C) (Oral)  Resp 16  SpO2 100%  Physical Exam  Nursing note and vitals reviewed. Constitutional: He is oriented to person, place, and time. He appears well-developed and well-nourished. No distress.  HENT:  Head: Normocephalic and atraumatic.  Eyes: Conjunctivae and EOM are normal.  Neck: Normal range of motion. Neck supple.  No meningeal signs  Cardiovascular: Normal rate, regular rhythm and normal heart sounds.   Pulmonary/Chest: Effort normal. No respiratory distress.  Abdominal: Soft.  Musculoskeletal: Normal range of motion. He exhibits no edema  and no tenderness.       Right hand: Normal. He exhibits normal range of motion, no tenderness, no bony tenderness, normal capillary refill, no deformity, no laceration and no swelling. Normal sensation noted.  Positive Finkelstein test of right thumb, no snuff box tenderness, no erythema, no warmth, no edema, and no ecchymosis, no deformity, no swelling, good grip strength   Neurological: He is alert and oriented to person, place, and time. No cranial nerve deficit.  Sensitivity to light touch intact, No focal deficits  Skin: Skin is warm and dry. He is not diaphoretic. No erythema.  Psychiatric: He has a normal mood and affect.    ED Course  Procedures (including critical care time) Medications  ibuprofen (ADVIL,MOTRIN) tablet 800 mg (not administered)  Dg Elbow Complete Right  03/13/2012  *RADIOLOGY REPORT*  Clinical Data: Increasing right hand and right elbow pain, fell a couple months ago  RIGHT ELBOW - COMPLETE 3+ VIEW  Comparison: None  Findings: Bone mineralization normal. Joint spaces preserved. No fracture, dislocation, or bone destruction. No joint effusion.  IMPRESSION: Normal exam.   Original Report Authenticated By: Ulyses Southward, M.D.    Dg Hand Complete Right  03/13/2012  *RADIOLOGY REPORT*  Clinical Data: Increasing right hand and right elbow pain, fell a couple months ago  RIGHT HAND - COMPLETE 3+ VIEW  Comparison: 02/02/2012  Findings: Bone mineralization normal. Joint spaces preserved. No fracture, dislocation, or bone destruction.  IMPRESSION: Normal exam.   Original Report Authenticated By: Ulyses Southward, M.D.      Labs Reviewed - No data to display No results found.   1. Chronic thumb pain, right       MDM  Reviewed note from 02/02/12. Pt presents with pain on dorsolateral aspect of the wrist upon ulnar deviation during Finkelstein test. Pt needs follow up. Discussed the fact that there was no bony injury when I was interrupted by significant other in the room who  aggressively questioned, "So y'all ain't gonna do nothing? That's it?! This is an Emergency ROOM. He should be able to get some help here. (To patient) "Don't you remember when you fell on your elbow last week?" Pt responds with blank look. (To me) "Don't you think that fall on the elbow could be aggravating his thumb?"  So, will get xray of right elbow and hand.   Xrays are negative for both right elbow and right hand:   No fracture, dislocation, or bone destruction.  Discussed xray results with pt as compared to previous. Re-iterated treatment from last visit. Pt states he lost the thumb spika so will order him another. Discussed use of ibuprofen and the fact that his at home pain meds and hx of fibromyalgia play a role as well. Emphasized to follow up. Provided hand specialist and resource list.   I personally performed the services described in this documentation, which was scribed in my presence. The  recorded information has been reviewed and is accurate.    Glade Nurse, PA-C 03/15/12 0021

## 2012-03-13 NOTE — ED Notes (Signed)
Pt c/o rt thumb pain x 1 wk.  Denies injury.  States he has been here for same before.

## 2012-03-13 NOTE — ED Notes (Signed)
Ortho tech called for thumb spica.  

## 2012-03-15 NOTE — ED Provider Notes (Signed)
Medical screening examination/treatment/procedure(s) were performed by non-physician practitioner and as supervising physician I was immediately available for consultation/collaboration.  Flint Melter, MD 03/15/12 2150

## 2013-03-09 ENCOUNTER — Encounter (HOSPITAL_COMMUNITY): Payer: Self-pay | Admitting: Emergency Medicine

## 2013-03-09 ENCOUNTER — Emergency Department (INDEPENDENT_AMBULATORY_CARE_PROVIDER_SITE_OTHER)
Admission: EM | Admit: 2013-03-09 | Discharge: 2013-03-09 | Disposition: A | Discharge status: Home / Self Care | Payer: No Typology Code available for payment source | Source: Home / Self Care | Attending: Emergency Medicine | Admitting: Emergency Medicine

## 2013-03-09 DIAGNOSIS — M7660 Achilles tendinitis, unspecified leg: Secondary | ICD-10-CM

## 2013-03-09 MED ORDER — NAPROXEN 500 MG PO TABS: 500.0000 mg | ORAL_TABLET | Freq: Two times a day (BID) | ORAL | Status: DC

## 2013-03-09 NOTE — Discharge Instructions (Signed)
Achilles Tendinitis °Achilles tendinitis is inflammation of the tough, cord-like band that attaches the lower muscles of your leg to your heel (Achilles tendon). It is usually caused by overusing the tendon and joint involved.  °CAUSES °Achilles tendinitis can happen because of: °· A sudden increase in exercise or activity (such as running). °· Doing the same exercises or activities (such as jumping) over and over. °· Not warming up calf muscles before exercising. °· Exercising in shoes that are worn out or not made for exercise. °· Having arthritis or a bone growth on the back of the heel bone. This can rub against the tendon and hurt the tendon. °SIGNS AND SYMPTOMS °The most common symptoms are: °· Pain in the back of the leg, just above the heel. The pain usually gets worse with exercise and better with rest. °· Stiffness or soreness in the back of the leg, especially in the morning. °· Swelling of the skin over the Achilles tendon. °· Trouble standing on tiptoe. °Sometimes, an Achilles tendon tears (ruptures). Symptoms of an Achilles tendon rupture can include: °· Sudden, severe pain in the back of the leg. °· Trouble putting weight on the foot or walking normally. °DIAGNOSIS °Achilles tendinitis will be diagnosed based on symptoms and a physical examination. An X-ray may be done to check if another condition is causing your symptoms. An MRI may be ordered if your health care provider suspects you may have completely torn your tendon, which is called an Achilles tendon rupture.  °TREATMENT  °Achilles tendinitis usually gets better over time. It can take weeks to months to heal completely. Treatment focuses on treating the symptoms and helping the injury heal. °HOME CARE INSTRUCTIONS  °· Rest your Achilles tendon and avoid activities that cause pain. °· Apply ice to the injured area: °· Put ice in a plastic bag. °· Place a towel between your skin and the bag. °· Leave the ice on for 20 minutes, 2 3 times a  day °· Try to avoid using the tendon (other than gentle range of motion) while the tendon is painful. Do not resume use until instructed by your health care provider. Then begin use gradually. Do not increase use to the point of pain. If pain does develop, decrease use and continue the above measures. Gradually increase activities that do not cause discomfort until you achieve normal use. °· Do exercises to make your calf muscles stronger and more flexible. Your health care provider or physical therapist can recommend exercises for you to do. °· Wrap your ankle with an elastic bandage or other wrap. This can help keep your tendon from moving too much. Your health care provider will show you how to wrap your ankle correctly. °· Only take over-the-counter or prescription medicines for pain, discomfort, or fever as directed by your health care provider. °SEEK MEDICAL CARE IF:  °· Your pain and swelling increase or pain is uncontrolled with medicines. °· You develop new, unexplained symptoms or your symptoms get worse. °· You are unable to move your toes or foot. °· You develop warmth and swelling in your foot. °· You have an unexplained temperature. °MAKE SURE YOU:  °· Understand these instructions. °· Will watch your condition. °· Will get help right away if you are not doing well or get worse. °Document Released: 09/28/2004 Document Revised: 10/09/2012 Document Reviewed: 07/31/2012 °[employer]® Patient Information ©2014 [employer]. ° °

## 2013-03-09 NOTE — ED Notes (Signed)
Pt  Reports  He  Woke  Up  Yesterday  With  Pain  r  Heel  /  Area radiating  Around  To  Ankle            He  denys  Any          specefic  Injury            No  Obvious  Deformity  Noted

## 2013-03-09 NOTE — ED Provider Notes (Signed)
CSN: [id number]     Arrival date & time 03/09/13  0920 History   First MD Initiated Contact with Patient 03/09/13 1002     Chief Complaint  Patient presents with  . Foot Pain   (Consider location/radiation/quality/duration/timing/severity/associated sxs/prior Treatment) HPI Comments: Pt woke up sx. Denies injury  Patient is a 43 y.o. male presenting with ankle pain. The history is provided by the patient.  Ankle Pain Location:  Ankle Time since incident:  1 day Injury: no   Ankle location:  R ankle Pain details:    Quality:  Aching   Radiates to:  Does not radiate   Severity:  Moderate   Onset quality:  Unable to specify   Duration:  1 day   Timing:  Constant   Progression:  Worsening Chronicity:  New Dislocation: no   Foreign body present:  No foreign bodies Prior injury to area:  No Relieved by:  None tried Worsened by:  Bearing weight Ineffective treatments:  Rest Associated symptoms: no fever, no numbness and no swelling     Past Medical History  Diagnosis Date  . Hyperlipidemia   . Hypertension   . Chronic pain in left shoulder    Past Surgical History  Procedure Laterality Date  . Knee surgery      left knee ligaments repair  . Anterior cruciate ligament repair      left knee  . Foot surgery      screw right foot 5th metatarsal  . Appendectomy     Family History  Problem Relation Age of Onset  . Hypertension    . Diabetes    . Coronary artery disease     History  Substance Use Topics  . Smoking status: Former Games developer  . Smokeless tobacco: Not on file     Comment: quit April 2006, smoked 1 1/2 pack/day  x 10 years  . Alcohol Use: No    Review of Systems  Constitutional: Negative for fever and chills.  Musculoskeletal:       R ankle pain  Skin: Negative for wound.  Neurological: Negative for numbness.    Allergies  Review of patient's allergies indicates no known allergies.  Home Medications   Current Outpatient Rx  Name  Route  Sig   Dispense  Refill  . Celecoxib (CELEBREX PO)   Oral   Take by mouth.         Marland Kitchen GABAPENTIN PO   Oral   Take by mouth.         . naproxen (NAPROSYN) 500 MG tablet   Oral   Take 1 tablet (500 mg total) by mouth 2 (two) times daily.   20 tablet   0   . zolpidem (AMBIEN) 10 MG tablet   Oral   Take 1 tablet (10 mg total) by mouth at bedtime as needed. For insomnia   90 tablet   1    BP 152/82  Pulse 72  Temp(Src) 98.6 F (37 C) (Oral)  Resp 18  SpO2 100% Physical Exam  Constitutional: He appears well-developed and well-nourished. No distress.  Musculoskeletal:       Right ankle: He exhibits normal range of motion, no swelling, no deformity and normal pulse. Achilles tendon exhibits pain. Achilles tendon exhibits no defect and normal Thompson's test results.  Pain in achilles tendon with palpation and range of motion and thompson's test  Skin: Skin is warm and dry. No erythema.    ED Course  Procedures (including critical care time)  Labs Review Labs Reviewed - No data to display Imaging Review No results found.   MDM   1. Achilles tendonitis   given aso. Already has crutches. Rest, ice. Rx naproxen 500mg  BID #10.      Cathlyn ParsonsAngela M Ahsley Attwood, NP 03/09/13 1007

## 2013-03-09 NOTE — ED Provider Notes (Signed)
Medical screening examination/treatment/procedure(s) were performed by non-physician practitioner and as supervising physician I was immediately available for consultation/collaboration.  Nuriya Stuck, M.D.  Shaylynne Lunt C Alejandra Barna, MD 03/09/13 1049 

## 2013-04-07 IMAGING — CR DG HAND COMPLETE 3+V*R*
3 series · 3 of 3 positions shown · non-contrast
Comparison: 02/02/2012

CLINICAL DATA: Increasing right hand and right elbow pain, fell a
couple months ago

RIGHT HAND - COMPLETE 3+ VIEW

[x hand pa right]
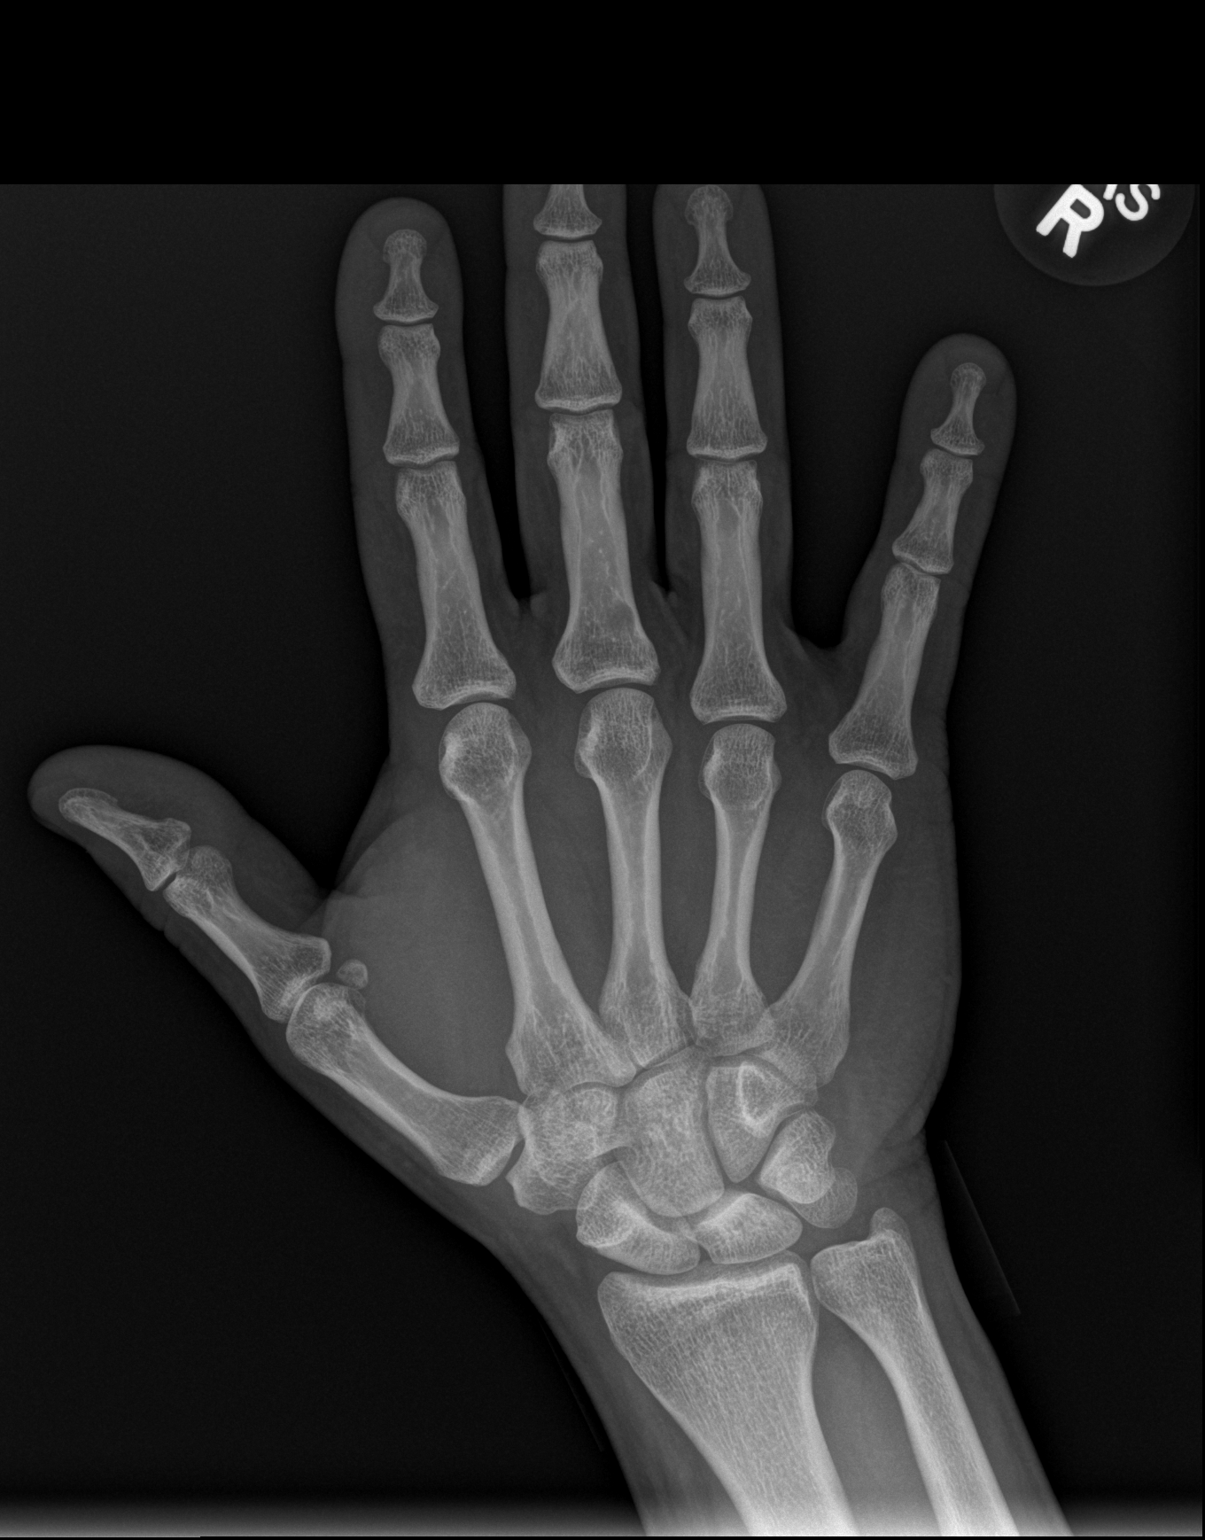

[x hand obl right]
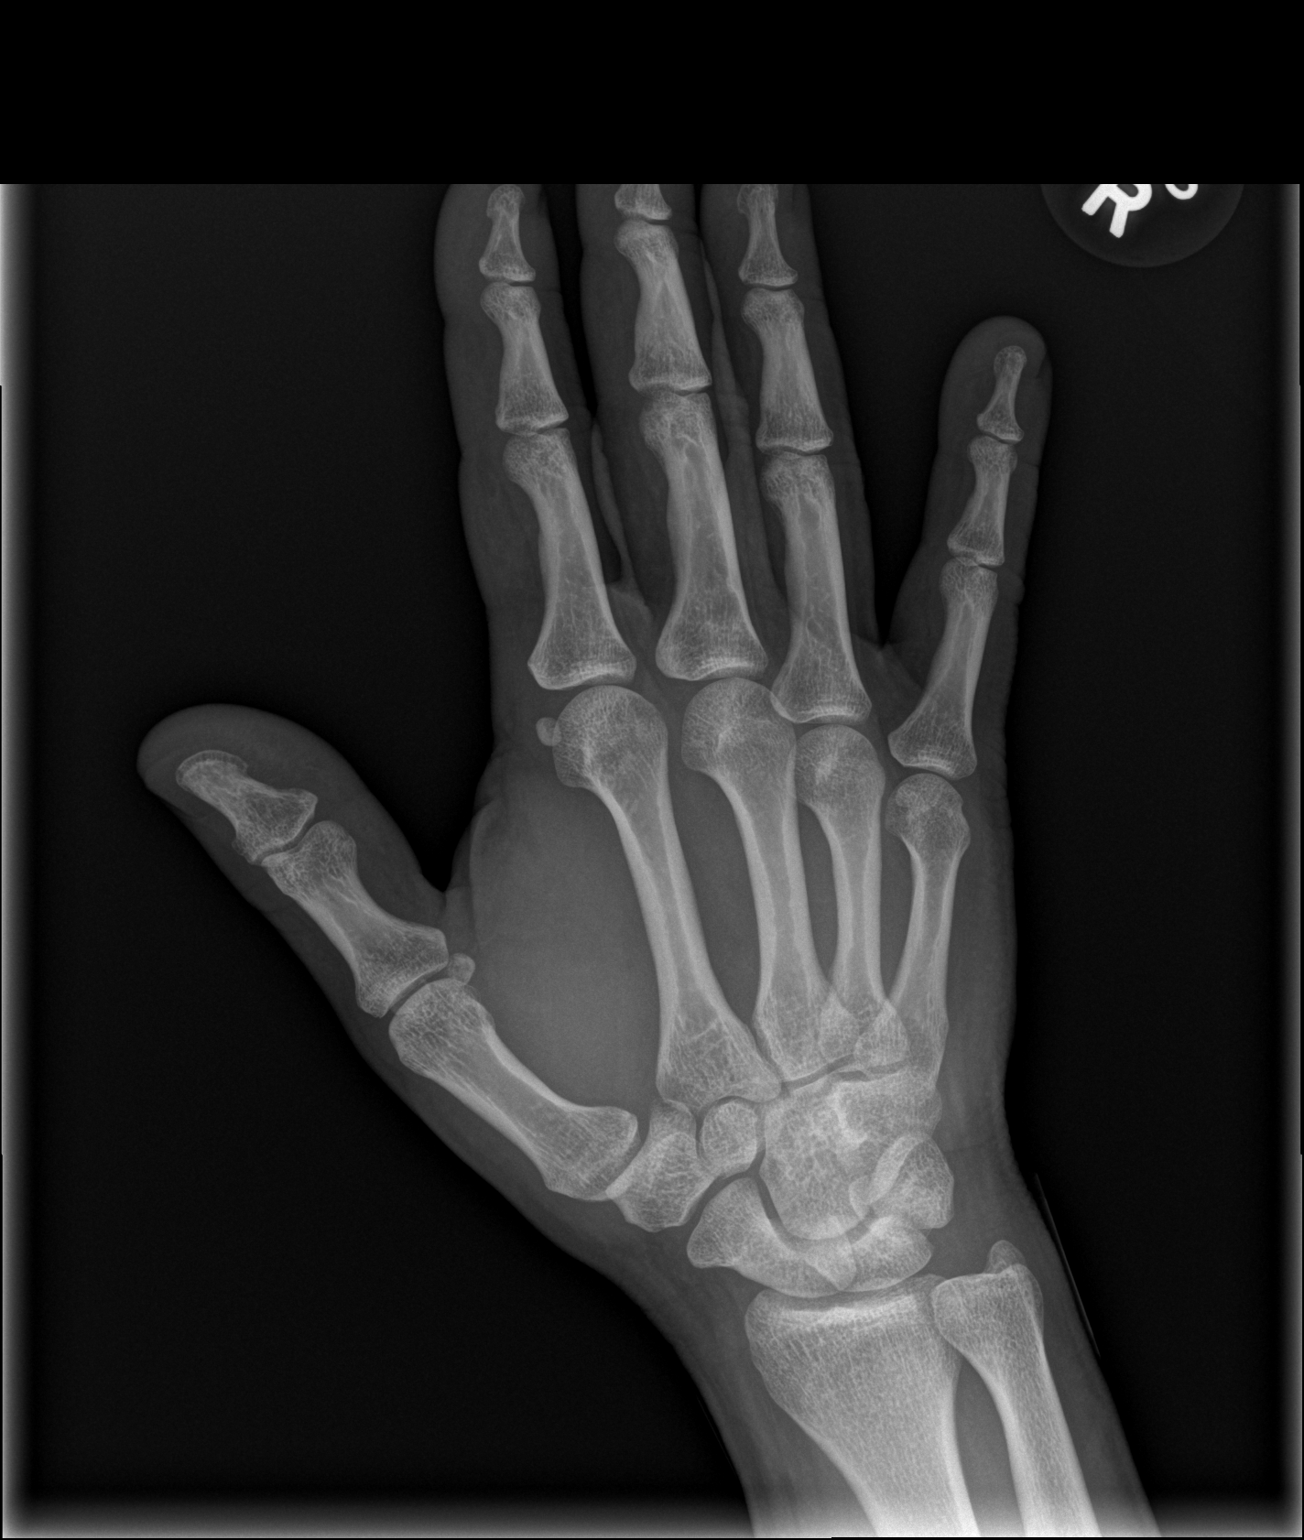

[x hand lat right]
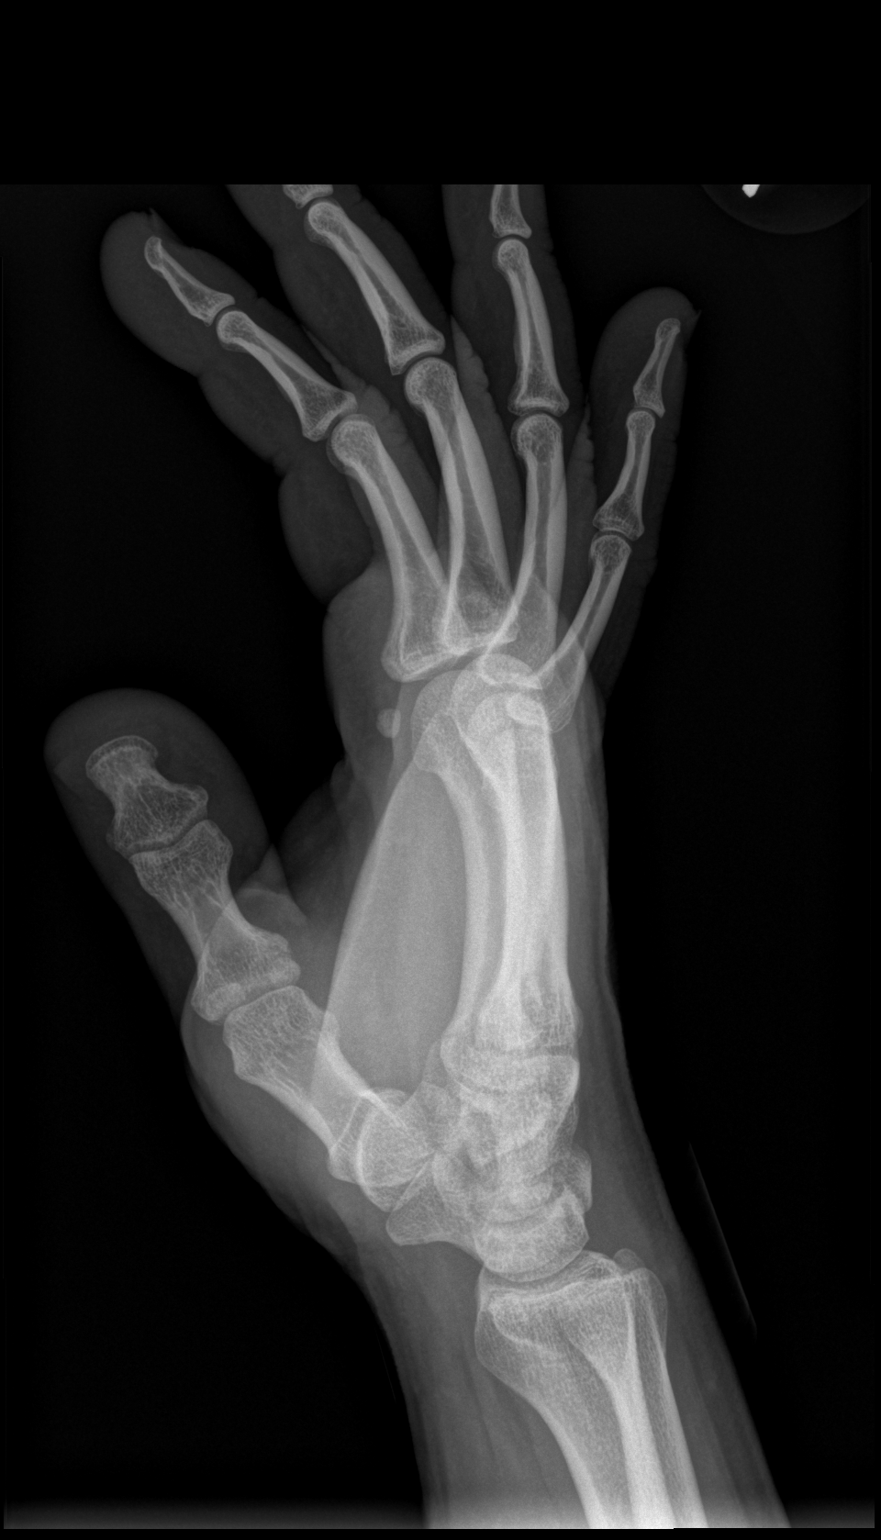

[3 of 3 positions shown; findings below may reference images not displayed]

FINDINGS: Bone mineralization normal.
Joint spaces preserved.
No fracture, dislocation, or bone destruction.
IMPRESSION: Normal exam.

## 2013-09-12 ENCOUNTER — Ambulatory Visit (INDEPENDENT_AMBULATORY_CARE_PROVIDER_SITE_OTHER): Payer: BC Managed Care – PPO | Admitting: Physician Assistant

## 2013-09-12 ENCOUNTER — Encounter: Payer: Self-pay | Admitting: Physician Assistant

## 2013-09-12 VITALS — BP 140/88 | HR 85 | Temp 98.0°F | Resp 20 | Ht 68.0 in | Wt 173.0 lb

## 2013-09-12 DIAGNOSIS — R21 Rash and other nonspecific skin eruption: Secondary | ICD-10-CM

## 2013-09-12 DIAGNOSIS — G4700 Insomnia, unspecified: Secondary | ICD-10-CM

## 2013-09-12 MED ORDER — ZOLPIDEM TARTRATE 10 MG PO TABS: 10.0000 mg | ORAL_TABLET | Freq: Every evening | ORAL | Status: DC | PRN

## 2013-09-12 MED ORDER — TRIAMCINOLONE ACETONIDE 0.1 % EX CREA: 1.0000 "application " | TOPICAL_CREAM | Freq: Two times a day (BID) | CUTANEOUS | Status: DC

## 2013-09-12 NOTE — Progress Notes (Signed)
Pre visit review using our clinic review tool, if applicable. No additional management support is needed unless otherwise documented below in the visit note. 

## 2013-09-12 NOTE — Patient Instructions (Addendum)
Trial of Triamcinolone cream on the rash 2 times per day to help relieve symptoms.   Ambien taken at night to help with insomnia. Do not drive while taking this medication as it will cause drowsiness.  If emergency symptoms discussed during visit developed, seek medical attention immediately.  Followup in 3 to 4 weeks with PCP to reassess, or for worsening or persistent symptoms despite treatment.    Restless Legs Syndrome Restless legs syndrome is a movement disorder. It may also be called a sensorimotor disorder.  CAUSES  No one knows what specifically causes restless legs syndrome, but it tends to run in families. It is also more common in people with low iron, in pregnancy, in people who need dialysis, and those with nerve damage (neuropathy).Some medications may make restless legs syndrome worse.Those medications include drugs to treat high blood pressure, some heart conditions, nausea, colds, allergies, and depression. SYMPTOMS Symptoms include uncomfortable sensations in the legs. These leg sensations are worse during periods of inactivity or rest. They are also worse while sitting or lying down. Individuals that have the disorder describe sensations in the legs that feel like:  Pulling.  Drawing.  Crawling.  Worming.  Boring.  Tingling.  Pins and needles.  Prickling.  Pain. The sensations are usually accompanied by an overwhelming urge to move the legs. Sudden muscle jerks may also occur. Movement provides temporary relief from the discomfort. In rare cases, the arms may also be affected. Symptoms may interfere with going to sleep (sleep onset insomnia). Restless legs syndrome may also be related to periodic limb movement disorder (PLMD). PLMD is another more common motor disorder. It also causes interrupted sleep. The symptoms from PLMD usually occur most often when you are awake. TREATMENT  Treatment for restless legs syndrome is symptomatic. This means that the  symptoms are treated.   Massage and cold compresses may provide temporary relief.  Walk, stretch, or take a cold or hot bath.  Get regular exercise and a good night's sleep.  Avoid caffeine, alcohol, nicotine, and medications that can make it worse.  Do activities that provide mental stimulation like discussions, needlework, and video games. These may be helpful if you are not able to walk or stretch. Some medications are effective in relieving the symptoms. However, many of these medications have side effects. Ask your caregiver about medications that may help your symptoms. Correcting iron deficiency may improve symptoms for some patients. Document Released: 12/09/2001 Document Revised: 05/05/2013 Document Reviewed: 03/17/2010 [facility] Patient Information 2015 [place], [place]. This information is not intended to replace advice given to you by your health care provider. Make sure you discuss any questions you have with your health care provider. Contact Dermatitis Contact dermatitis is a rash that happens when something touches the skin. You touched something that irritates your skin, or you have allergies to something you touched. HOME CARE   Avoid the thing that caused your rash.  Keep your rash away from hot water, soap, sunlight, chemicals, and other things that might bother it.  Do not scratch your rash.  You can take cool baths to help stop itching.  Only take medicine as told by your doctor.  Keep all doctor visits as told. GET HELP RIGHT AWAY IF:   Your rash is not better after 3 days.  Your rash gets worse.  Your rash is puffy (swollen), tender, red, sore, or warm.  You have problems with your medicine. MAKE SURE YOU:   Understand these instructions.  Will watch your  condition.  Will get help right away if you are not doing well or get worse. Document Released: 10/16/2008 Document Revised: 03/13/2011 Document Reviewed: 05/24/2010 [facility] Patient Information  2015 [place], [place]. This information is not intended to replace advice given to you by your health care provider. Make sure you discuss any questions you have with your health care provider. Insomnia Insomnia is frequent trouble falling and/or staying asleep. Insomnia can be a long term problem or a short term problem. Both are common. Insomnia can be a short term problem when the wakefulness is related to a certain stress or worry. Long term insomnia is often related to ongoing stress during waking hours and/or poor sleeping habits. Overtime, sleep deprivation itself can make the problem worse. Every little thing feels more severe because you are overtired and your ability to cope is decreased. CAUSES   Stress, anxiety, and depression.  Poor sleeping habits.  Distractions such as TV in the bedroom.  Naps close to bedtime.  Engaging in emotionally charged conversations before bed.  Technical reading before sleep.  Alcohol and other sedatives. They may make the problem worse. They can hurt normal sleep patterns and normal dream activity.  Stimulants such as caffeine for several hours prior to bedtime.  Pain syndromes and shortness of breath can cause insomnia.  Exercise late at night.  Changing time zones may cause sleeping problems (jet lag). It is sometimes helpful to have someone observe your sleeping patterns. They should look for periods of not breathing during the night (sleep apnea). They should also look to see how long those periods last. If you live alone or observers are uncertain, you can also be observed at a sleep clinic where your sleep patterns will be professionally monitored. Sleep apnea requires a checkup and treatment. Give your caregivers your medical history. Give your caregivers observations your family has made about your sleep.  SYMPTOMS   Not feeling rested in the morning.  Anxiety and restlessness at bedtime.  Difficulty falling and staying  asleep. TREATMENT   Your caregiver may prescribe treatment for an underlying medical disorders. Your caregiver can give advice or help if you are using alcohol or other drugs for self-medication. Treatment of underlying problems will usually eliminate insomnia problems.  Medications can be prescribed for short time use. They are generally not recommended for lengthy use.  Over-the-counter sleep medicines are not recommended for lengthy use. They can be habit forming.  You can promote easier sleeping by making lifestyle changes such as:  Using relaxation techniques that help with breathing and reduce muscle tension.  Exercising earlier in the day.  Changing your diet and the time of your last meal. No night time snacks.  Establish a regular time to go to bed.  Counseling can help with stressful problems and worry.  Soothing music and white noise may be helpful if there are background noises you cannot remove.  Stop tedious detailed work at least one hour before bedtime. HOME CARE INSTRUCTIONS   Keep a diary. Inform your caregiver about your progress. This includes any medication side effects. See your caregiver regularly. Take note of:  Times when you are asleep.  Times when you are awake during the night.  The quality of your sleep.  How you feel the next day. This information will help your caregiver care for you.  Get out of bed if you are still awake after 15 minutes. Read or do some quiet activity. Keep the lights down. Wait until you feel sleepy  and go back to bed.  Keep regular sleeping and waking hours. Avoid naps.  Exercise regularly.  Avoid distractions at bedtime. Distractions include watching television or engaging in any intense or detailed activity like attempting to balance the household checkbook.  Develop a bedtime ritual. Keep a familiar routine of bathing, brushing your teeth, climbing into bed at the same time each night, listening to soothing music.  Routines increase the success of falling to sleep faster.  Use relaxation techniques. This can be using breathing and muscle tension release routines. It can also include visualizing peaceful scenes. You can also help control troubling or intruding thoughts by keeping your mind occupied with boring or repetitive thoughts like the old concept of counting sheep. You can make it more creative like imagining planting one beautiful flower after another in your backyard garden.  During your day, work to eliminate stress. When this is not possible use some of the previous suggestions to help reduce the anxiety that accompanies stressful situations. MAKE SURE YOU:   Understand these instructions.  Will watch your condition.  Will get help right away if you are not doing well or get worse. Document Released: 12/17/1999 Document Revised: 03/13/2011 Document Reviewed: 01/16/2007 [facility] Patient Information 2015 [place], [place]. This information is not intended to replace advice given to you by your health care provider. Make sure you discuss any questions you have with your health care provider.

## 2013-09-12 NOTE — Progress Notes (Signed)
Subjective:    Patient ID: Francisco Juarez, male    DOB: [birth year], 43 y.o.   MRN: [id number]  Rash This is a new problem. The current episode started more than 1 month ago. The problem has been waxing and waning since onset. The affected locations include the left lower leg. The rash is characterized by itchiness and redness. He was exposed to nothing. Pertinent negatives include no anorexia, congestion, cough, diarrhea, eye pain, facial edema, fatigue, fever, joint pain, nail changes, rhinorrhea, shortness of breath, sore throat or vomiting. Treatments tried: peroxide and calamine lotion. The treatment provided mild relief. There is no history of allergies, asthma or eczema.   Pt has also had difficulty sleeping and in the past had taken [place] with success. He is requesting to have a trail of ambien again. He states that he tolerated this well and denies adverse effects to treatment.   Review of Systems  Constitutional: Negative for fever, chills and fatigue.  HENT: Negative for congestion, rhinorrhea and sore throat.   Eyes: Negative for pain.  Respiratory: Negative for cough and shortness of breath.   Cardiovascular: Negative for chest pain.  Gastrointestinal: Negative for nausea, vomiting, abdominal pain, diarrhea and anorexia.  Musculoskeletal: Negative for joint pain.  Skin: Positive for rash. Negative for nail changes.  Neurological: Negative for syncope and headaches.  All other systems reviewed and are negative.    Past Medical History  Diagnosis Date  . Hyperlipidemia   . Hypertension   . Chronic pain in left shoulder     History   Social History  . Marital Status: Married    Spouse Name: N/A    Number of Children: N/A  . Years of Education: N/A   Occupational History  . Not on file.   Social History Main Topics  . Smoking status: Former Games developer  . Smokeless tobacco: Not on file     Comment: quit April 2006, smoked 1 1/2 pack/day  x 10 years  . Alcohol Use: No   . Drug Use: No  . Sexual Activity: Yes    Birth Control/ Protection: None   Other Topics Concern  . Not on file   Social History Narrative   Reviewed history from 01/18/2007 and no changes required:         Married         Former Smoker -  quit April 2006.  smoked 1 1/2 pack/day x 10 years         Alcohol use-no         Drug use-no         Occupation:  Laboratory clinical tech     Past Surgical History  Procedure Laterality Date  . Knee surgery      left knee ligaments repair  . Anterior cruciate ligament repair      left knee  . Foot surgery      screw right foot 5th metatarsal  . Appendectomy      Family History  Problem Relation Age of Onset  . Hypertension    . Diabetes    . Coronary artery disease      No Known Allergies  Current Outpatient Prescriptions on File Prior to Visit  Medication Sig Dispense Refill  . zolpidem (AMBIEN) 10 MG tablet Take 1 tablet (10 mg total) by mouth at bedtime as needed. For insomnia  90 tablet  1  . naproxen (NAPROSYN) 500 MG tablet Take 1 tablet (500 mg total) by mouth 2 (two) times  daily.  20 tablet  0   No current facility-administered medications on file prior to visit.    EXAM: BP 140/88  Pulse 85  Temp(Src) 98 F (36.7 C) (Oral)  Resp 20  Ht  (1.727 m)  Wt 173 lb (78.472 kg)  BMI 26.31 kg/m2  SpO2 98%     Objective:   Physical Exam  Nursing note and vitals reviewed. Constitutional: He is oriented to person, place, and time. He appears well-developed and well-nourished. No distress.  HENT:  Head: Normocephalic and atraumatic.  Eyes: Conjunctivae and EOM are normal.  Cardiovascular: Normal rate, regular rhythm and intact distal pulses.   Pulmonary/Chest: Effort normal and breath sounds normal. No respiratory distress. He exhibits no tenderness.  Musculoskeletal: Normal range of motion. He exhibits no edema and no tenderness.  Neurological: He is alert and oriented to person, place, and time.  Skin: Skin  is warm and dry. Rash noted. He is not diaphoretic. No pallor.  Small red, nonraised rash on the left lateral lower leg near the knee. Visible scratch marks. No surrounding erythema or swelling, no fluctuance.  Psychiatric: He has a normal mood and affect. His behavior is normal. Judgment and thought content normal.    Lab Results  Component Value Date   WBC 10.6* 04/07/2011   HGB 15.6 04/07/2011   HCT 46.7 04/07/2011   PLT 264.0 04/07/2011   GLUCOSE 89 04/07/2011   CHOL 189 12/02/2010   TRIG 107.0 12/02/2010   HDL 34.40* 12/02/2010   LDLCALC 133* 12/02/2010   ALT 23 04/07/2011   AST 21 04/07/2011   NA 138 04/07/2011   K 3.7 04/07/2011   CL 104 04/07/2011   CREATININE 1.1 04/07/2011   BUN 12 04/07/2011   CO2 24 04/07/2011   TSH 1.77 12/02/2010   INR 0.98 05/18/2009       Assessment & Plan:  Shamar was seen today for rash and pruritis.  Diagnoses and associated orders for this visit:  Rash and nonspecific skin eruption Comments: [place], ?contact. trial of topical triamcinolone cream. try and ID causative agents in environment. watchful waiting.  - triamcinolone cream (KENALOG) 0.1 %; Apply 1 application topically 2 (two) times daily.  Insomnia Comments: re-trial of ambien at night. Tolerated in the past. Sleep hygiene discussed and info provided. - zolpidem (AMBIEN) 10 MG tablet; Take 1 tablet (10 mg total) by mouth at bedtime as needed. For insomnia    Patient also has a complaint about restless legs with a history of fibromyalgia in his legs which he is going to discuss with Dr. Artist Pais at his next office visit.  Return precautions provided, and patient handout on insomnia, contact dermatitis, and restless leg syndrome.  Plan to follow up in 3 to 4 weeks with PCP to reassess, or for worsening or persistent symptoms despite treatment.  Patient Instructions  Trial of Triamcinolone cream on the rash 2 times per day to help relieve symptoms.   Ambien taken at night to help with insomnia. Do not  drive while taking this medication as it will cause drowsiness.  If emergency symptoms discussed during visit developed, seek medical attention immediately.  Followup in 3 to 4 weeks with PCP to reassess, or for worsening or persistent symptoms despite treatment.

## 2013-10-16 ENCOUNTER — Telehealth: Payer: Self-pay | Admitting: Internal Medicine

## 2013-10-16 NOTE — Telephone Encounter (Signed)
Ok with me 

## 2013-10-16 NOTE — Telephone Encounter (Signed)
Sure - if ok with Dr. Artist PaisYoo.

## 2013-10-16 NOTE — Telephone Encounter (Signed)
Pt would like to know if you will accept him?  Pt sees dr Artist Paisyoo and he is not as available as they need. Will you accept him? Is that ok w/ you Dr Artist PaisYoo?

## 2013-10-20 ENCOUNTER — Ambulatory Visit: Payer: BC Managed Care – PPO | Admitting: Internal Medicine

## 2013-10-20 DIAGNOSIS — Z0289 Encounter for other administrative examinations: Secondary | ICD-10-CM

## 2013-10-22 NOTE — Telephone Encounter (Signed)
appt has been sch °

## 2013-10-23 ENCOUNTER — Encounter: Payer: Self-pay | Admitting: Family Medicine

## 2013-10-23 ENCOUNTER — Ambulatory Visit (INDEPENDENT_AMBULATORY_CARE_PROVIDER_SITE_OTHER): Payer: BC Managed Care – PPO | Admitting: Family Medicine

## 2013-10-23 VITALS — BP 128/84 | HR 82 | Temp 98.0°F | Ht 68.0 in | Wt 173.2 lb

## 2013-10-23 DIAGNOSIS — Z23 Encounter for immunization: Secondary | ICD-10-CM

## 2013-10-23 DIAGNOSIS — R258 Other abnormal involuntary movements: Secondary | ICD-10-CM

## 2013-10-23 DIAGNOSIS — R251 Tremor, unspecified: Secondary | ICD-10-CM | POA: Insufficient documentation

## 2013-10-23 DIAGNOSIS — R29898 Other symptoms and signs involving the musculoskeletal system: Secondary | ICD-10-CM

## 2013-10-23 DIAGNOSIS — Z7189 Other specified counseling: Secondary | ICD-10-CM

## 2013-10-23 DIAGNOSIS — M2550 Pain in unspecified joint: Secondary | ICD-10-CM

## 2013-10-23 DIAGNOSIS — E785 Hyperlipidemia, unspecified: Secondary | ICD-10-CM

## 2013-10-23 DIAGNOSIS — Z7689 Persons encountering health services in other specified circumstances: Secondary | ICD-10-CM

## 2013-10-23 DIAGNOSIS — R748 Abnormal levels of other serum enzymes: Secondary | ICD-10-CM

## 2013-10-23 LAB — BASIC METABOLIC PANEL
BUN: 11 mg/dL (ref 6–23)
CALCIUM: 9.8 mg/dL (ref 8.4–10.5)
CO2: 26 mEq/L (ref 19–32)
Chloride: 105 mEq/L (ref 96–112)
Creatinine, Ser: 1.1 mg/dL (ref 0.4–1.5)
GFR: 81.48 mL/min (ref >60.00)
GLUCOSE: 73 mg/dL (ref 70–99)
Potassium: 4.7 mEq/L (ref 3.5–5.1)
SODIUM: 139 meq/L (ref 135–145)

## 2013-10-23 LAB — CBC WITH DIFFERENTIAL/PLATELET
BASOS PCT: 0.3 % (ref 0.0–3.0)
Basophils Absolute: 0.1 10*3/uL (ref 0.0–0.1)
EOS PCT: 0.9 % (ref 0.0–5.0)
Eosinophils Absolute: 0.2 10*3/uL (ref 0.0–0.7)
HEMATOCRIT: 51.5 % (ref 39.0–52.0)
Hemoglobin: 16.9 g/dL (ref 13.0–17.0)
LYMPHS ABS: 2.9 10*3/uL (ref 0.7–4.0)
Lymphocytes Relative: 16 % (ref 12.0–46.0)
MCHC: 32.8 g/dL (ref 30.0–36.0)
MCV: 90.1 fl (ref 78.0–100.0)
MONO ABS: 1.4 10*3/uL — AB (ref 0.1–1.0)
Monocytes Relative: 7.9 % (ref 3.0–12.0)
Neutro Abs: 13.7 10*3/uL — ABNORMAL HIGH (ref 1.4–7.7)
Neutrophils Relative %: 74.9 % (ref 43.0–77.0)
PLATELETS: 295 10*3/uL (ref 150.0–400.0)
RBC: 5.72 Mil/uL (ref 4.22–5.81)
RDW: 13.2 % (ref 11.5–15.5)

## 2013-10-23 LAB — LIPID PANEL
CHOLESTEROL: 246 mg/dL — AB (ref 0–200)
HDL: 33 mg/dL — AB (ref >39.00)
LDL Cholesterol: 188 mg/dL — ABNORMAL HIGH (ref 0–99)
NonHDL: 213
TRIGLYCERIDES: 123 mg/dL (ref 0.0–149.0)
Total CHOL/HDL Ratio: 7
VLDL: 24.6 mg/dL (ref 0.0–40.0)

## 2013-10-23 LAB — HEMOGLOBIN A1C: Hgb A1c MFr Bld: 5.9 % (ref 4.6–6.5)

## 2013-10-23 NOTE — Patient Instructions (Addendum)
BEFORE YOU LEAVE: -Tdap -labs -follow up in about 3 months after seeing rheumatologist  Please know that I do not prescribe Ambien  Please quit smoking and let us know if we can help  -We placed a referral for you as discussed to the rheumatologist for the leg and other pain and tremors and poor sleep. It usually takes about 1-2 weeks to process and schedule this referral. If you have not heard from us regarding this appointment in 2 weeks please contact our office.  -We have ordered labs or studies at this visit. It can take up to 1-2 weeks for results and processing. We will contact you with instructions IF your results are abnormal. Normal results will be [facility]. If you have not heard from us or can not find your results in [facility] in 2 weeks please contact our office.  -PLEASE SIGN UP FOR MYCHART TODAY   We recommend the following healthy lifestyle measures: - eat a healthy diet consisting of lots of vegetables, fruits, beans, nuts, seeds, healthy meats such as white chicken and fish and whole grains.  - avoid fried foods, fast food, processed foods, sodas, red meet and other fattening foods.  - get a least 150 minutes of aerobic exercise per week.

## 2013-10-23 NOTE — Progress Notes (Signed)
No chief complaint on file.   HPI:  Francisco Juarez is here to establish care. Wants lab work today.  Has the following chronic problems and concerns today:  Patient Active Problem List   Diagnosis Date Noted  . Weakness of both legs 10/23/2013  . Occasional tremors 10/23/2013  . Elevated CK, hx of 10/23/2013  . Fibromyalgia syndrome 06/21/2011  . Arthralgia 04/28/2011  . Erectile dysfunction 04/28/2011  . Bilateral leg and foot pain 04/07/2011  . INSOMNIA, CHRONIC 01/26/2010  . HYPERLIPIDEMIA 04/03/2007  . DERMATITIS 04/03/2007   Pain in multiple joints/Tremors/Poor sleep: -started > 5 years ago -hx of fibromyalgia per his report, wife thinks he has seizures in his sleep as legs have tremors, "clacking of knees together" at night and has mod-severe pain in both knees and ankles and is worse at night and wakes him up -he frequently has burning and aching elbow and shoulder joint pain -he has not noticed swelling or erythema of legs -he reports night sweats for at least 3 years  -these symptoms result in poor sleep and takes [place] for this -he feels like legs throughout at times - and feels like may fall and did fall once in the morning from this - "legs gave out" -reports did see a rheumatologist > 5 years ago and had a sleep study for RLS and this was negative - but he thinks he has RLS.  -reports took gabapentin in the past and this did not help -Denies: fevers, unintentional weight loss, hearing or vision loss, chest pain struggling to breath, hemoptysis, melena, hematochezia, hematuria, falls, loc, si, thoughts of self harm, depression or anxiety, numbness, bowel or bladder incontinence, no hx trauma to head or back, HA -on ROC: hx elevated uric acid, elevated CK in 2013, elevated WBCs remotely; normal ferritin, neg RF, CRP, ESR, HOV, RPR, adolase, LFT, ANA  HLD/Elevated BP: -reports hx of, no medications -reports mild -denies: CP, SOB, DOE, swelling  Tobacco use: -report  mild interest in quitting, has quit on his own in the past -will consider quitline, quitcoach, wellbutrin, nicotine replacement -denies:SOB, cough, hemoptysis, oral lesions   Past Medical History  Diagnosis Date  . Hyperlipidemia   . Hypertension   . Chronic pain in left shoulder     Past Surgical History  Procedure Laterality Date  . Knee surgery      left knee ligaments repair  . Anterior cruciate ligament repair      left knee  . Foot surgery      screw right foot 5th metatarsal  . Appendectomy      Family History  Problem Relation Age of Onset  . Hypertension    . Diabetes    . Coronary artery disease      History   Social History  . Marital Status: Married    Spouse Name: N/A    Number of Children: N/A  . Years of Education: N/A   Social History Main Topics  . Smoking status: Current Every Day Smoker  . Smokeless tobacco: None     Comment: quit April 2006, smoked 1 1/2 pack/day  x 10 years  . Alcohol Use: No  . Drug Use: No  . Sexual Activity: Yes    Birth Control/ Protection: None   Other Topics Concern  . None   Social History Narrative   Reviewed history from 01/18/2007 and no changes required:         Married         Former Smoker -  quit April 2006.  smoked 1 1/2 pack/day x 10 years         Alcohol use-no         Drug use-no         Occupation:  Laboratory clinical tech     Current outpatient prescriptions:triamcinolone cream (KENALOG) 0.1 %, Apply 1 application topically 2 (two) times daily., Disp: 30 g, Rfl: 0;  zolpidem (AMBIEN) 10 MG tablet, Take 1 tablet (10 mg total) by mouth at bedtime as needed. For insomnia, Disp: 30 tablet, Rfl: 0  EXAM:  Filed Vitals:   10/23/13 0923  BP: 128/84  Pulse: 82  Temp: 98 F (36.7 C)    Body mass index is 26.34 kg/(m^2).  GENERAL: vitals reviewed and listed above, alert, oriented, appears well hydrated and in no acute distress  HEENT: atraumatic, conjunttiva clear, no obvious abnormalities on  inspection of external nose and ears  NECK: no obvious masses on inspection  LUNGS: clear to auscultation bilaterally, no wheezes, rales or rhonchi, good air movement  CV: HRRR, no peripheral edema  MS: moves all extremities without noticeable abnormality  PSYCH: pleasant and cooperative, no obvious depression or anxiety  ASSESSMENT AND PLAN:  Discussed the following assessment and plan:  Polyarthralgia - Plan: Ambulatory referral to Rheumatology, Ambulatory referral to Rheumatology Occasional tremors - Plan: Ambulatory referral to Rheumatology, Ambulatory referral to Rheumatology Weakness of both legs - Plan: Ambulatory referral to Rheumatology -on roc labs in 2013 showed elevated CK, elevated uric acid, elevated WBCs; normal ferritin, neg RF, CRP, ESR, HOV, RPR, adolase, LFT, ANA -he reports symptoms are progressively worse and advised rheum referral for further eval and management  Hyperlipemia - Plan: Lipid Panel  Encounter to establish care - Plan: Hemoglobin O1H, Basic metabolic panel, CBC with Differential  -We reviewed the PMH, PSH, FH, SH, Meds and Allergies. -We provided refills for any medications we will prescribe as needed. -We addressed current concerns per orders and patient instructions. -We have asked for records for pertinent exams, studies, vaccines and notes from previous providers. -We have advised patient to follow up per instructions below.   -Patient advised to return or notify a doctor immediately if symptoms worsen or persist or new concerns arise.  Patient Instructions  BEFORE YOU LEAVE: -Tdap -labs -follow up in about 3 months after seeing rheumatologist  Please know that I do not prescribe Ambien  Please quit smoking and let us know if we can help  -We placed a referral for you as discussed to the rheumatologist for the leg and other pain and tremors and poor sleep. It usually takes about 1-2 weeks to process and schedule this referral. If you  have not heard from [place] regarding this appointment in 2 weeks please contact our office.  -We have ordered labs or studies at this visit. It can take up to 1-2 weeks for results and processing. We will contact you with instructions IF your results are abnormal. Normal results will be released to your [facility]. If you have not heard from [place] or can not find your results in [facility] in 2 weeks please contact our office.  -PLEASE SIGN UP FOR MYCHART TODAY   We recommend the following healthy lifestyle measures: - eat a healthy diet consisting of lots of vegetables, fruits, beans, nuts, seeds, healthy meats such as white chicken and fish and whole grains.  - avoid fried foods, fast food, processed foods, sodas, red meet and other fattening foods.  - get a least 150 minutes  of aerobic exercise per week.       Colin Benton R.

## 2013-10-23 NOTE — Addendum Note (Signed)
Addended by: Alfred LevinsWYRICK, CINDY D on: 10/23/2013 10:44 AM   Modules accepted: Orders

## 2013-10-23 NOTE — Addendum Note (Signed)
Addended by: Sallee LangeFUNDERBURK, JO A on: 10/23/2013 01:00 PM   Modules accepted: Orders

## 2013-10-23 NOTE — Addendum Note (Signed)
Addended by: Alfred LevinsWYRICK, CINDY D on: 10/23/2013 11:33 AM   Modules accepted: Orders

## 2013-10-23 NOTE — Progress Notes (Signed)
Pre visit review using our clinic review tool, if applicable. No additional management support is needed unless otherwise documented below in the visit note. 

## 2013-10-24 ENCOUNTER — Telehealth: Payer: Self-pay | Admitting: Family Medicine

## 2013-10-24 ENCOUNTER — Other Ambulatory Visit (INDEPENDENT_AMBULATORY_CARE_PROVIDER_SITE_OTHER): Payer: BC Managed Care – PPO

## 2013-10-24 DIAGNOSIS — D649 Anemia, unspecified: Secondary | ICD-10-CM

## 2013-10-24 DIAGNOSIS — D72829 Elevated white blood cell count, unspecified: Secondary | ICD-10-CM

## 2013-10-24 LAB — CBC WITH DIFFERENTIAL/PLATELET
BASOS ABS: 0 10*3/uL (ref 0.0–0.1)
Basophils Relative: 0.3 % (ref 0.0–3.0)
EOS ABS: 0.1 10*3/uL (ref 0.0–0.7)
Eosinophils Relative: 1.1 % (ref 0.0–5.0)
HCT: 45.7 % (ref 39.0–52.0)
Hemoglobin: 15.1 g/dL (ref 13.0–17.0)
LYMPHS PCT: 30 % (ref 12.0–46.0)
Lymphs Abs: 3.9 10*3/uL (ref 0.7–4.0)
MCHC: 33 g/dL (ref 30.0–36.0)
MCV: 90.7 fl (ref 78.0–100.0)
Monocytes Absolute: 1.5 10*3/uL — ABNORMAL HIGH (ref 0.1–1.0)
Monocytes Relative: 11.4 % (ref 3.0–12.0)
NEUTROS PCT: 57.2 % (ref 43.0–77.0)
Neutro Abs: 7.4 10*3/uL (ref 1.4–7.7)
PLATELETS: 281 10*3/uL (ref 150.0–400.0)
RBC: 5.04 Mil/uL (ref 4.22–5.81)
RDW: 12.7 % (ref 11.5–15.5)
WBC: 12.9 10*3/uL — ABNORMAL HIGH (ref 4.0–10.5)

## 2013-10-24 MED ORDER — PRAVASTATIN SODIUM 40 MG PO TABS: 40.0000 mg | ORAL_TABLET | Freq: Every day | ORAL | Status: DC

## 2013-10-24 NOTE — Telephone Encounter (Signed)
emmi mailed  °

## 2013-10-24 NOTE — Addendum Note (Signed)
Addended by: Johnella MoloneyFUNDERBURK, Kama Cammarano A on: 10/24/2013 09:37 AM   Modules accepted: Orders

## 2013-10-27 ENCOUNTER — Encounter: Payer: Self-pay | Admitting: *Deleted

## 2013-10-27 ENCOUNTER — Telehealth: Payer: Self-pay | Admitting: Hematology

## 2013-10-27 NOTE — Telephone Encounter (Signed)
S/W PATIENT AND GAVE NP APPT FOR 11/05 @ 1:30 W/DR. SEHBAI REFERRING DR. HANNAH KIM DX-ELEVATED WBC   CHART DELIVERED ON 10/26 FOR NP APPT 11/05

## 2013-11-06 ENCOUNTER — Ambulatory Visit: Payer: BC Managed Care – PPO

## 2013-11-07 ENCOUNTER — Encounter: Payer: Self-pay | Admitting: Family Medicine

## 2013-11-11 ENCOUNTER — Telehealth: Payer: Self-pay | Admitting: Hematology and Oncology

## 2013-11-11 NOTE — Telephone Encounter (Signed)
LEFT MESSAGE FOR PATIENT AND GAVE NP APPT FOR 11/19 @ 10:45 W/DR. GORSUCH.  CONTACT INFORMATION WAS LEFT FOR PATIENT TO RETURN CALL TO CONFIRM MESSAGE/NP APPT.

## 2013-11-20 ENCOUNTER — Encounter: Payer: Self-pay | Admitting: Hematology and Oncology

## 2013-11-20 ENCOUNTER — Telehealth: Payer: Self-pay | Admitting: Hematology and Oncology

## 2013-11-20 ENCOUNTER — Ambulatory Visit: Payer: BC Managed Care – PPO

## 2013-11-20 ENCOUNTER — Ambulatory Visit (HOSPITAL_BASED_OUTPATIENT_CLINIC_OR_DEPARTMENT_OTHER): Payer: BC Managed Care – PPO | Admitting: Hematology and Oncology

## 2013-11-20 ENCOUNTER — Encounter (INDEPENDENT_AMBULATORY_CARE_PROVIDER_SITE_OTHER): Payer: Self-pay

## 2013-11-20 VITALS — BP 155/97 | HR 78 | Temp 98.2°F | Resp 18 | Ht 67.5 in | Wt 174.3 lb

## 2013-11-20 DIAGNOSIS — M2550 Pain in unspecified joint: Secondary | ICD-10-CM

## 2013-11-20 DIAGNOSIS — F17200 Nicotine dependence, unspecified, uncomplicated: Secondary | ICD-10-CM | POA: Insufficient documentation

## 2013-11-20 DIAGNOSIS — Z720 Tobacco use: Secondary | ICD-10-CM

## 2013-11-20 DIAGNOSIS — D72829 Elevated white blood cell count, unspecified: Secondary | ICD-10-CM | POA: Insufficient documentation

## 2013-11-20 HISTORY — DX: Elevated white blood cell count, unspecified: D72.829

## 2013-11-20 NOTE — Assessment & Plan Note (Signed)
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 I recommend a trial of high-dose vitamin D supplement.

## 2013-11-20 NOTE — Progress Notes (Signed)
Checked in new pt with no financial concerns at this time.  Pt is here for a hematology concern so no financial assistance may be needed but he has my card for any questions or concerns.

## 2013-11-20 NOTE — Progress Notes (Signed)
[facility] CONSULT NOTE  Patient Care Team: Terressa Koyanagi, DO as PCP - General (Family Medicine) Aasim Nelva Bush, MD as Consulting Physician (Hematology)  CHIEF COMPLAINTS/PURPOSE OF CONSULTATION:  Chronic leukocytosis.  HISTORY OF PRESENTING ILLNESS:  Francisco Juarez 43 y.o. male is here because of elevated WBC.  He was found to have abnormal CBC from routine blood work monitoring. I have the opportunity to review his CBC from 2011. His white blood cell count has fluctuated from 10.6-18.3. He denies recent infection. The last prescription antibiotics was more than 3 months ago. He has intermittent nasal drainage and mild productive cough for the past few months. He has recurrent dermatitis on his skin, relief with corticosteroid cream. He has chronic bone pain and was diagnosed with fibromyalgia in the past.  There is not reported symptoms of urinary frequency/urgency or dysuria or diarrhea.  He had no prior history or diagnosis of cancer. His age appropriate screening programs are up-to-date. The patient has no prior diagnosis of autoimmune disease.  The patient is a smoker and currently smokes 1 pack of cigarettes per day for the last 19 years.  MEDICAL HISTORY:  Past Medical History  Diagnosis Date  . Hyperlipidemia   . Hypertension   . Chronic pain in left shoulder   . Leukocytosis 11/20/2013    SURGICAL HISTORY: Past Surgical History  Procedure Laterality Date  . Knee surgery      left knee ligaments repair  . Anterior cruciate ligament repair      left knee  . Foot surgery      screw right foot 5th metatarsal  . Appendectomy      SOCIAL HISTORY: History   Social History  . Marital Status: Married    Spouse Name: N/A    Number of Children: N/A  . Years of Education: N/A   Occupational History  . Not on file.   Social History Main Topics  . Smoking status: Current Every Day Smoker -- 1.00 packs/day for 19 years  . Smokeless tobacco: Never  Used     Comment: quit April 2006, smoked 1 1/2 pack/day  x 10 years  . Alcohol Use: No  . Drug Use: No  . Sexual Activity: Yes    Birth Control/ Protection: None   Other Topics Concern  . Not on file   Social History Narrative   Reviewed history from 01/18/2007 and no changes required:         Married         Former Smoker -  quit April 2006.  smoked 1 1/2 pack/day x 10 years         Alcohol use-no         Drug use-no         Occupation:  Laboratory clinical tech     FAMILY HISTORY: Family History  Problem Relation Age of Onset  . Hypertension    . Diabetes    . Coronary artery disease    . Cancer Maternal Grandmother     lung ca    ALLERGIES:  has No Known Allergies.  MEDICATIONS:  Current Outpatient Prescriptions  Medication Sig Dispense Refill  . pravastatin (PRAVACHOL) 40 MG tablet Take 1 tablet (40 mg total) by mouth daily. 30 tablet 3  . triamcinolone cream (KENALOG) 0.1 % Apply 1 application topically 2 (two) times daily. 30 g 0  . zolpidem (AMBIEN) 10 MG tablet Take 1 tablet (10 mg total) by mouth at bedtime as needed. For  insomnia 30 tablet 0   No current facility-administered medications for this visit.    REVIEW OF SYSTEMS:   Constitutional: Denies fevers, chills or abnormal night sweats Eyes: Denies blurriness of vision, double vision or watery eyes Ears, nose, mouth, throat, and face: Denies mucositis or sore throat Cardiovascular: Denies palpitation, chest discomfort or lower extremity swelling Gastrointestinal:  Denies nausea, heartburn or change in bowel habits Lymphatics: Denies new lymphadenopathy or easy bruising Neurological:Denies numbness, tingling or new weaknesses Behavioral/Psych: Mood is stable, no new changes  All other systems were reviewed with the patient and are negative.  PHYSICAL EXAMINATION: ECOG PERFORMANCE STATUS: 0 - Asymptomatic  Filed Vitals:   11/20/13 1038  BP: 155/97  Pulse: 78  Temp: 98.2 F (36.8 C)  Resp: 18    Filed Weights   11/20/13 1038  Weight: 174 lb 4.8 oz (79.062 kg)    GENERAL:alert, no distress and comfortable SKIN: skin color, texture, turgor are normal, no rashes or significant lesions EYES: normal, conjunctiva are pink and non-injected, sclera clear OROPHARYNX:no exudate, no erythema and lips, buccal mucosa, and tongue normal  NECK: supple, thyroid normal size, non-tender, without nodularity LYMPH:  no palpable lymphadenopathy in the cervical, axillary or inguinal LUNGS: clear to auscultation and percussion with normal breathing effort HEART: regular rate & rhythm and no murmurs and no lower extremity edema ABDOMEN:abdomen soft, non-tender and normal bowel sounds Musculoskeletal:no cyanosis of digits and no clubbing  PSYCH: alert & oriented x 3 with fluent speech NEURO: no focal motor/sensory deficits  LABORATORY DATA:  I have reviewed the data as listed Recent Results (from the past 2160 hour(s))  Lipid Panel     Status: Abnormal   Collection Time: 10/23/13 11:32 AM  Result Value Ref Range   Cholesterol 246 (H) 0 - 200 mg/dL    Comment: ATP III Classification       Desirable:  < 200 mg/dL               Borderline High:  200 - 239 mg/dL          High:  > = 161 mg/dL   Triglycerides 096.0 0.0 - 149.0 mg/dL    Comment: Normal:  <454 mg/dLBorderline High:  150 - 199 mg/dL   HDL 09.81 (L) >19.14 mg/dL   VLDL 78.2 0.0 - 95.6 mg/dL   LDL Cholesterol 213 (H) 0 - 99 mg/dL   Total CHOL/HDL Ratio 7     Comment:                Men          Women1/2 Average Risk     3.4          3.3Average Risk          5.0          4.42X Average Risk          9.6          7.13X Average Risk          15.0          11.0                       NonHDL 213.00     Comment: NOTE:  Non-HDL goal should be 30 mg/dL higher than patient's LDL goal (i.e. LDL goal of < 70 mg/dL, would have non-HDL goal of < 100 mg/dL)  CBC with Differential     Status: Abnormal   Collection Time:  10/23/13 11:32 AM  Result Value  Ref Range   WBC 18.3 Repeated and verified X2. (HH) 4.0 - 10.5 K/uL   RBC 5.72 4.22 - 5.81 Mil/uL   Hemoglobin 16.9 13.0 - 17.0 g/dL   HCT 65.751.5 84.639.0 - 96.252.0 %   MCV 90.1 78.0 - 100.0 fl   MCHC 32.8 30.0 - 36.0 g/dL   RDW 95.213.2 84.111.5 - 32.415.5 %   Platelets 295.0 150.0 - 400.0 K/uL   Neutrophils Relative % 74.9 43.0 - 77.0 %   Lymphocytes Relative 16.0 12.0 - 46.0 %   Monocytes Relative 7.9 3.0 - 12.0 %   Eosinophils Relative 0.9 0.0 - 5.0 %   Basophils Relative 0.3 0.0 - 3.0 %   Neutro Abs 13.7 (H) 1.4 - 7.7 K/uL   Lymphs Abs 2.9 0.7 - 4.0 K/uL   Monocytes Absolute 1.4 (H) 0.1 - 1.0 K/uL   Eosinophils Absolute 0.2 0.0 - 0.7 K/uL   Basophils Absolute 0.1 0.0 - 0.1 K/uL  Basic Metabolic Panel     Status: None   Collection Time: 10/23/13 11:32 AM  Result Value Ref Range   Sodium 139 135 - 145 mEq/L   Potassium 4.7 3.5 - 5.1 mEq/L   Chloride 105 96 - 112 mEq/L   CO2 26 19 - 32 mEq/L   Glucose, Bld 73 70 - 99 mg/dL   BUN 11 6 - 23 mg/dL   Creatinine, Ser 1.1 0.4 - 1.5 mg/dL   Calcium 9.8 8.4 - 40.110.5 mg/dL   GFR 02.7281.48 >53.66>60.00 mL/min  Hemoglobin A1c     Status: None   Collection Time: 10/23/13 11:32 AM  Result Value Ref Range   Hgb A1c MFr Bld 5.9 4.6 - 6.5 %    Comment: Glycemic Control Guidelines for People with Diabetes:Non Diabetic:  <6%Goal of Therapy: <7%Additional Action Suggested:  >8%   CBC with Differential     Status: Abnormal   Collection Time: 10/24/13  3:10 PM  Result Value Ref Range   WBC 12.9 (H) 4.0 - 10.5 K/uL   RBC 5.04 4.22 - 5.81 Mil/uL   Hemoglobin 15.1 13.0 - 17.0 g/dL   HCT 44.045.7 34.739.0 - 42.552.0 %   MCV 90.7 78.0 - 100.0 fl   MCHC 33.0 30.0 - 36.0 g/dL   RDW 95.612.7 38.711.5 - 56.415.5 %   Platelets 281.0 150.0 - 400.0 K/uL   Neutrophils Relative % 57.2 43.0 - 77.0 %   Lymphocytes Relative 30.0 12.0 - 46.0 %   Monocytes Relative 11.4 3.0 - 12.0 %   Eosinophils Relative 1.1 0.0 - 5.0 %   Basophils Relative 0.3 0.0 - 3.0 %   Neutro Abs 7.4 1.4 - 7.7 K/uL   Lymphs Abs 3.9  0.7 - 4.0 K/uL   Monocytes Absolute 1.5 (H) 0.1 - 1.0 K/uL   Eosinophils Absolute 0.1 0.0 - 0.7 K/uL   Basophils Absolute 0.0 0.0 - 0.1 K/uL    ASSESSMENT & PLAN Leukocytosis This is chronic in nature with fluctuation of his white blood cell count up and down, unrelated to infection. I suspect this could be due to reactive leukocytosis from smoking. I recommend he quit smoking and recheck CBC next year.  Smoking I spent some time counseling the patient the importance of tobacco cessation. he is currently attempting to quit on his own  I gave him patient education handout and encouraged him to sign up for smoking cessation class.   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  I recommend a trial of high-dose vitamin D supplement.

## 2013-11-20 NOTE — Assessment & Plan Note (Signed)
I spent some time counseling the patient the importance of tobacco cessation. he is currently attempting to quit on his own  I gave him patient education handout and encouraged him to sign up for smoking cessation class.  

## 2013-11-20 NOTE — Telephone Encounter (Signed)
lvm for pt regarding to July 2016 appt....mailed pt appt sched/avs and letter °

## 2013-11-20 NOTE — Assessment & Plan Note (Signed)
This is chronic in nature with fluctuation of his white blood cell count up and down, unrelated to infection. I suspect this could be due to reactive leukocytosis from smoking. I recommend he quit smoking and recheck CBC next year.

## 2013-12-29 ENCOUNTER — Telehealth: Payer: Self-pay | Admitting: Family Medicine

## 2013-12-29 NOTE — Telephone Encounter (Signed)
error 

## 2014-07-23 ENCOUNTER — Ambulatory Visit: Payer: BC Managed Care – PPO | Admitting: Hematology and Oncology

## 2014-07-23 ENCOUNTER — Encounter: Payer: Self-pay | Admitting: Hematology and Oncology

## 2014-07-23 ENCOUNTER — Other Ambulatory Visit: Payer: BC Managed Care – PPO

## 2014-07-24 ENCOUNTER — Encounter: Payer: Self-pay | Admitting: *Deleted

## 2014-07-24 NOTE — Progress Notes (Signed)
Letter mailed to patient due to "No Show" of appointments.

## 2014-09-03 ENCOUNTER — Ambulatory Visit (INDEPENDENT_AMBULATORY_CARE_PROVIDER_SITE_OTHER): Payer: BLUE CROSS/BLUE SHIELD | Admitting: Family Medicine

## 2014-09-03 ENCOUNTER — Encounter: Payer: Self-pay | Admitting: Family Medicine

## 2014-09-03 VITALS — BP 144/100 | HR 86 | Temp 98.2°F | Ht 67.5 in | Wt 174.5 lb

## 2014-09-03 DIAGNOSIS — I10 Essential (primary) hypertension: Secondary | ICD-10-CM | POA: Diagnosis not present

## 2014-09-03 DIAGNOSIS — E785 Hyperlipidemia, unspecified: Secondary | ICD-10-CM | POA: Diagnosis not present

## 2014-09-03 DIAGNOSIS — Z720 Tobacco use: Secondary | ICD-10-CM

## 2014-09-03 DIAGNOSIS — F17200 Nicotine dependence, unspecified, uncomplicated: Secondary | ICD-10-CM

## 2014-09-03 MED ORDER — BUPROPION HCL ER (SR) 150 MG PO TB12
150.0000 mg | ORAL_TABLET | Freq: Every day | ORAL | Status: DC
Start: 2014-09-03 — End: 2020-07-03

## 2014-09-03 MED ORDER — PRAVASTATIN SODIUM 40 MG PO TABS: 40.0000 mg | ORAL_TABLET | Freq: Every day | ORAL | Status: DC

## 2014-09-03 MED ORDER — HYDROCHLOROTHIAZIDE 25 MG PO TABS: 25.0000 mg | ORAL_TABLET | Freq: Every day | ORAL | Status: DC

## 2014-09-03 NOTE — Progress Notes (Signed)
Pre visit review using our clinic review tool, if applicable. No additional management support is needed unless otherwise documented below in the visit note. 

## 2014-09-03 NOTE — Progress Notes (Signed)
HPI:  Francisco Juarez is a 44 yo M whom I saw once and whom we have not seen in some time whom presents for follow up. He was advised to follow up 3 months after his last visit, but did not.  HTN: -intermittent hx of elevated BP in the past, normal at visit with me -reports: BP elevated when gave blood today and told needs to see PCP -denies: CP, SOB, DOE, vision changes, HA today  Hx HLD: -advised statin and 3 month follow up in 10/2013 -repots he thinks was expensive but can't remember - wants refill -no exercise, poor diet  Tobacco use: -advised to quit -reports: he does want to quit and wants to try medication -denies: SOB, hemoptysis, fevers, weight loss  Leukocytosis/Osteomalacia: -referred to oncology and per notes felt 2ndary to smoking, they wanted to follow yearly per notes, last OV 11/2013 -per onc notes doing trial vit D for osteomalcia  All of his pain and joint and tremor issues resolved. He reports he plans to follow up with onc this fall.   ROS: See pertinent positives and negatives per HPI.  Past Medical History  Diagnosis Date  . Hyperlipidemia   . Hypertension   . Chronic pain in left shoulder   . Leukocytosis 11/20/2013    referred to heamtology in 2015  . Polyarthralgia     referred to rheum in 20a5    Past Surgical History  Procedure Laterality Date  . Knee surgery      left knee ligaments repair  . Anterior cruciate ligament repair      left knee  . Foot surgery      screw right foot 5th metatarsal  . Appendectomy      Family History  Problem Relation Age of Onset  . Hypertension    . Diabetes    . Coronary artery disease    . Cancer Maternal Grandmother     lung ca    Social History   Social History  . Marital Status: Married    Spouse Name: N/A  . Number of Children: N/A  . Years of Education: N/A   Social History Main Topics  . Smoking status: Current Every Day Smoker -- 1.00 packs/day for 19 years  . Smokeless tobacco:  Never Used     Comment: quit April 2006, smoked 1 1/2 pack/day  x 10 years  . Alcohol Use: No  . Drug Use: No  . Sexual Activity: Yes    Birth Control/ Protection: None   Other Topics Concern  . None   Social History Narrative   Reviewed history from 01/18/2007 and no changes required:         Married         Former Smoker -  quit April 2006.  smoked 1 1/2 pack/day x 10 years         Alcohol use-no         Drug use-no         Occupation:  Laboratory clinical tech      Current outpatient prescriptions:  .  buPROPion (WELLBUTRIN SR) 150 MG 12 hr tablet, Take 1 tablet (150 mg total) by mouth daily., Disp: 90 tablet, Rfl: 0 .  hydrochlorothiazide (HYDRODIURIL) 25 MG tablet, Take 1 tablet (25 mg total) by mouth daily., Disp: 90 tablet, Rfl: 3 .  pravastatin (PRAVACHOL) 40 MG tablet, Take 1 tablet (40 mg total) by mouth daily., Disp: 30 tablet, Rfl: 3  EXAM:  Filed Vitals:   09/03/14  1512  BP: 144/100  Pulse: 86  Temp: 98.2 F (36.8 C)    Body mass index is 26.91 kg/(m^2).  GENERAL: vitals reviewed and listed above, alert, oriented, appears well hydrated and in no acute distress  HEENT: atraumatic, conjunttiva clear, no obvious abnormalities on inspection of external nose and ears  NECK: no obvious masses on inspection  LUNGS: clear to auscultation bilaterally, no wheezes, rales or rhonchi, good air movement  CV: HRRR, no peripheral edema  MS: moves all extremities without noticeable abnormality  PSYCH: pleasant and cooperative, no obvious depression or anxiety  ASSESSMENT AND PLAN:  Discussed the following assessment and plan:  Essential hypertension - Plan: hydrochlorothiazide (HYDRODIURIL) 25 MG tablet -discussed options, risks, implications -advised quit smoking, exercise, healthy diet and options for medications -he opted to try hctz -follow up in 1 month, labs then  Hyperlipemia - Plan: pravastatin (PRAVACHOL) 40 MG tablet -restart statin, lifestyle  recs -advised to call if cost issues  Smoking - Plan: buPROPion (WELLBUTRIN SR) 150 MG 12 hr tablet -advised to quit, counseled for 5 minutes - motivational interviewing on desires, reasons, motivation level to quit, treatment options -he decided to try wellbutrin  -Patient advised to return or notify a doctor immediately if symptoms worsen or persist or new concerns arise.  There are no Patient Instructions on file for this visit.   Kriste Basque R.

## 2014-09-03 NOTE — Patient Instructions (Signed)
BEFORE YOU LEAVE: -schedule follow up in 1 month   Start the blood pressure medication - hydrochlorthiazide once daily  Restart the cholesterol medication - let me know if this is too expensive and your pharmacist's recs for a cheaper option.  Start the wellbutrin today and take once daily. Quit smoking in 1 week. You can do this!

## 2014-10-14 ENCOUNTER — Encounter: Payer: Self-pay | Admitting: Family Medicine

## 2014-10-14 ENCOUNTER — Ambulatory Visit (INDEPENDENT_AMBULATORY_CARE_PROVIDER_SITE_OTHER): Payer: BLUE CROSS/BLUE SHIELD | Admitting: Family Medicine

## 2014-10-14 VITALS — BP 132/92 | HR 77 | Temp 98.4°F | Ht 67.5 in | Wt 176.2 lb

## 2014-10-14 DIAGNOSIS — F17200 Nicotine dependence, unspecified, uncomplicated: Secondary | ICD-10-CM

## 2014-10-14 DIAGNOSIS — I10 Essential (primary) hypertension: Secondary | ICD-10-CM

## 2014-10-14 DIAGNOSIS — Z720 Tobacco use: Secondary | ICD-10-CM | POA: Diagnosis not present

## 2014-10-14 DIAGNOSIS — E785 Hyperlipidemia, unspecified: Secondary | ICD-10-CM

## 2014-10-14 DIAGNOSIS — D72829 Elevated white blood cell count, unspecified: Secondary | ICD-10-CM

## 2014-10-14 LAB — LIPID PANEL
CHOL/HDL RATIO: 6
CHOLESTEROL: 213 mg/dL — AB (ref 0–200)
HDL: 33 mg/dL — ABNORMAL LOW (ref >39.00)
LDL CALC: 159 mg/dL — AB (ref 0–99)
NonHDL: 180.22
Triglycerides: 107 mg/dL (ref 0.0–149.0)
VLDL: 21.4 mg/dL (ref 0.0–40.0)

## 2014-10-14 LAB — CBC WITH DIFFERENTIAL/PLATELET
BASOS PCT: 0.4 % (ref 0.0–3.0)
Basophils Absolute: 0 10*3/uL (ref 0.0–0.1)
EOS PCT: 1.8 % (ref 0.0–5.0)
Eosinophils Absolute: 0.2 10*3/uL (ref 0.0–0.7)
HEMATOCRIT: 48.6 % (ref 39.0–52.0)
HEMOGLOBIN: 15.8 g/dL (ref 13.0–17.0)
Lymphocytes Relative: 35.8 % (ref 12.0–46.0)
Lymphs Abs: 3.9 10*3/uL (ref 0.7–4.0)
MCHC: 32.5 g/dL (ref 30.0–36.0)
MCV: 91.7 fl (ref 78.0–100.0)
MONO ABS: 1.1 10*3/uL — AB (ref 0.1–1.0)
Monocytes Relative: 10.5 % (ref 3.0–12.0)
NEUTROS ABS: 5.6 10*3/uL (ref 1.4–7.7)
Neutrophils Relative %: 51.5 % (ref 43.0–77.0)
PLATELETS: 287 10*3/uL (ref 150.0–400.0)
RBC: 5.3 Mil/uL (ref 4.22–5.81)
RDW: 13 % (ref 11.5–15.5)
WBC: 10.8 10*3/uL — AB (ref 4.0–10.5)

## 2014-10-14 LAB — BASIC METABOLIC PANEL
BUN: 11 mg/dL (ref 6–23)
CHLORIDE: 107 meq/L (ref 96–112)
CO2: 27 meq/L (ref 19–32)
Calcium: 9.7 mg/dL (ref 8.4–10.5)
Creatinine, Ser: 1.02 mg/dL (ref 0.40–1.50)
GFR: 84.78 mL/min (ref >60.00)
Glucose, Bld: 96 mg/dL (ref 70–99)
POTASSIUM: 4.4 meq/L (ref 3.5–5.1)
Sodium: 140 mEq/L (ref 135–145)

## 2014-10-14 NOTE — Progress Notes (Signed)
Pre visit review using our clinic review tool, if applicable. No additional management support is needed unless otherwise documented below in the visit note. 

## 2014-10-14 NOTE — Progress Notes (Signed)
HPI:  Follow up:  HTN: -starte hctz 09/2014 -reports: taking medication daily and was checking BP at home and was looking great in the 120/70-80s -denies: CP, SOB, HA, swelling -reports got bad new that grandmother is dying suddenly and mother is stressing him out and is stressed today and he feels this is why his BP is up today  Tobacco use -counseled, rx for wellbutrin 09/2014 -reports: was cutting back, though has not quit with recent stress this week -denies: CP, SOb, cough, congestion -heme onc advised to quit smoking and recheck cbc in 1 year  HLD: -restarted statin 09/2014 -tolerating well  ROS: See pertinent positives and negatives per HPI.  Past Medical History  Diagnosis Date  . Hyperlipidemia   . Hypertension   . Chronic pain in left shoulder   . Leukocytosis 11/20/2013    referred to heamtology in 2015  . Polyarthralgia     referred to rheum in 20a5    Past Surgical History  Procedure Laterality Date  . Knee surgery      left knee ligaments repair  . Anterior cruciate ligament repair      left knee  . Foot surgery      screw right foot 5th metatarsal  . Appendectomy      Family History  Problem Relation Age of Onset  . Hypertension    . Diabetes    . Coronary artery disease    . Cancer Maternal Grandmother     lung ca    Social History   Social History  . Marital Status: Married    Spouse Name: N/A  . Number of Children: N/A  . Years of Education: N/A   Social History Main Topics  . Smoking status: Current Every Day Smoker -- 1.00 packs/day for 19 years  . Smokeless tobacco: Never Used     Comment: quit April 2006, smoked 1 1/2 pack/day  x 10 years  . Alcohol Use: No  . Drug Use: No  . Sexual Activity: Yes    Birth Control/ Protection: None   Other Topics Concern  . None   Social History Narrative   Reviewed history from 01/18/2007 and no changes required:         Married         Former Smoker -  quit April 2006.  smoked 1 1/2  pack/day x 10 years         Alcohol use-no         Drug use-no         Occupation:  Laboratory clinical tech      Current outpatient prescriptions:  .  buPROPion (WELLBUTRIN SR) 150 MG 12 hr tablet, Take 1 tablet (150 mg total) by mouth daily., Disp: 90 tablet, Rfl: 0 .  hydrochlorothiazide (HYDRODIURIL) 25 MG tablet, Take 1 tablet (25 mg total) by mouth daily., Disp: 90 tablet, Rfl: 3 .  pravastatin (PRAVACHOL) 40 MG tablet, Take 1 tablet (40 mg total) by mouth daily., Disp: 30 tablet, Rfl: 3  EXAM:  Filed Vitals:   10/14/14 1059  BP: 132/92  Pulse: 77  Temp: 98.4 F (36.9 C)    Body mass index is 27.17 kg/(m^2).  GENERAL: vitals reviewed and listed above, alert, oriented, appears well hydrated and in no acute distress  HEENT: atraumatic, conjunttiva clear, no obvious abnormalities on inspection of external nose and ears  NECK: no obvious masses on inspection  LUNGS: clear to auscultation bilaterally, no wheezes, rales or rhonchi, good air movement  CV: HRRR, no peripheral edema  MS: moves all extremities without noticeable abnormality  PSYCH: pleasant and cooperative, no obvious depression or anxiety  ASSESSMENT AND PLAN:  Discussed the following assessment and plan:  Hyperlipemia - Plan: Lipid Panel  Smoking  Essential hypertension - Plan: Basic metabolic panel  Leukocytosis - Plan: CBC with Differential  -bmp, lipid panel, cbc with diff - will forward to hematologist as he did not check this at ca center -counseled with current stress -discussed options for HTN and tx and risks - he prefers to follow up closely rather making changes to medication today as feels is related to stress -lifestyle recs -counseled on smoking cessation he -plans to wait one month to process things with grandmother prior to official quit date -Patient advised to return or notify a doctor immediately if symptoms worsen or persist or new concerns arise.  Patient Instructions   BEFORE YOU LEAVE: -labs -schedule follow up in 1-2 months  We recommend the following healthy lifestyle measures: - eat a healthy whole foods diet consisting of regular small meals composed of vegetables, fruits, beans, nuts, seeds, healthy meats such as white chicken and fish and whole grains.  - avoid sweets, white starchy foods, fried foods, fast food, processed foods, sodas, red meet and other fattening foods.  - get a least 150-300 minutes of aerobic exercise per week.   Please set quit date in 1 months and quit smoking, continue wellbutrin for now      Kriste Basque R.

## 2014-10-14 NOTE — Patient Instructions (Addendum)
BEFORE YOU LEAVE: -labs -schedule follow up in 1-2 months  We recommend the following healthy lifestyle measures: - eat a healthy whole foods diet consisting of regular small meals composed of vegetables, fruits, beans, nuts, seeds, healthy meats such as white chicken and fish and whole grains.  - avoid sweets, white starchy foods, fried foods, fast food, processed foods, sodas, red meet and other fattening foods.  - get a least 150-300 minutes of aerobic exercise per week.   Please set quit date in 1 months and quit smoking, continue wellbutrin for now

## 2014-10-20 MED ORDER — ROSUVASTATIN CALCIUM 20 MG PO TABS: 20.0000 mg | ORAL_TABLET | Freq: Every day | ORAL | Status: DC

## 2014-10-20 NOTE — Addendum Note (Signed)
Addended by: Johnella MoloneyFUNDERBURK, Jaquilla Woodroof A on: 10/20/2014 08:44 AM   Modules accepted: Orders, Medications

## 2014-12-09 ENCOUNTER — Ambulatory Visit (INDEPENDENT_AMBULATORY_CARE_PROVIDER_SITE_OTHER): Payer: BLUE CROSS/BLUE SHIELD | Admitting: Family Medicine

## 2014-12-09 DIAGNOSIS — R69 Illness, unspecified: Secondary | ICD-10-CM

## 2014-12-09 NOTE — Progress Notes (Signed)
NO SHOW

## 2016-02-09 ENCOUNTER — Encounter (HOSPITAL_COMMUNITY): Payer: Self-pay

## 2016-09-21 ENCOUNTER — Encounter: Payer: Self-pay | Admitting: Family Medicine

## 2017-01-11 ENCOUNTER — Encounter: Payer: Self-pay | Admitting: Family Medicine

## 2017-10-17 ENCOUNTER — Ambulatory Visit (HOSPITAL_COMMUNITY)
Admission: EM | Admit: 2017-10-17 | Discharge: 2017-10-17 | Disposition: A | Discharge status: Home / Self Care | Payer: BLUE CROSS/BLUE SHIELD | Attending: Family Medicine | Admitting: Family Medicine

## 2017-10-17 ENCOUNTER — Encounter (HOSPITAL_COMMUNITY): Payer: Self-pay

## 2017-10-17 DIAGNOSIS — S0501XA Injury of conjunctiva and corneal abrasion without foreign body, right eye, initial encounter: Secondary | ICD-10-CM

## 2017-10-17 DIAGNOSIS — R51 Headache: Secondary | ICD-10-CM

## 2017-10-17 DIAGNOSIS — R519 Headache, unspecified: Secondary | ICD-10-CM

## 2017-10-17 MED ORDER — TETRACAINE HCL 0.5 % OP SOLN
OPHTHALMIC | Status: AC
  Filled 2017-10-17: qty 4

## 2017-10-17 MED ORDER — HYDROCODONE-ACETAMINOPHEN 5-325 MG PO TABS: 1.0000 | ORAL_TABLET | Freq: Four times a day (QID) | ORAL | 0 refills | Status: DC | PRN

## 2017-10-17 NOTE — Discharge Instructions (Signed)
Please follow up with an eye doctor if you are not feeling better within 48 hours. You may use over the counter ibuprofen as needed.

## 2017-10-17 NOTE — ED Triage Notes (Signed)
Pt presents with right eye irritation and headache.

## 2017-10-17 NOTE — ED Provider Notes (Signed)
[facility]   [id number] 10/17/17 Arrival Time: 1325  ASSESSMENT & PLAN:  1. Abrasion of right cornea, initial encounter   2. Nonintractable headache, unspecified chronicity pattern, unspecified headache type    Meds ordered this encounter  Medications  . HYDROcodone-acetaminophen (NORCO/VICODIN) 5-325 MG tablet    Sig: Take 1 tablet by mouth every 6 (six) hours as needed for moderate pain or severe pain.    Dispense:  4 tablet    Refill:  0   Headache related to corneal abrasion. Declines work note. Medication sedation precautions. Ibuprofen as baseline for discomfort. Instructed to not wear contact lenses until better. Probably has some underlying chronic allergic conjunctivitis. Feels Td is UTD. Will check. To f/u with ophthalmology if not improving significantly over the next 24-48 hours. Declines work note.  Reviewed expectations re: course of current medical issues. Questions answered. Outlined signs and symptoms indicating need for more acute intervention. Patient verbalized understanding. After Visit Summary given.   SUBJECTIVE:  Francisco Juarez is a 47 y.o. male who presents with complaint of persistent discomfort of right eye.  Onset abrupt, today. Underlying chronic bilateral eye irritation from dust at work but right eye with increased discomfort. Questions scratched. Injury: none that he can recall but rubs eyes frequently at work Visual changes: none. Contact lens use: yes. Some light sensitivity. Works cutting trees. Much dust. Wears safety glasses. Self treatment: none. Flushed eye with water. Questions mild help. Reports chronic underlying "itchy and irritated eyes" from dust at work. Mild HA associated with R eye discomfort. No n/v. Ambulatory without difficulty. No vertigo. No dysequilibrium. No OTC analgesics taken.  ROS: As per HPI. All other systems negative.   OBJECTIVE:  Vitals:   10/17/17 1438  BP: (!) 165/89  Pulse: 68  Resp: 20    Temp: 98 F (36.7 C)  TempSrc: Oral  SpO2: 100%    General appearance: alert; no distress HEENT: normocephalic; atraumatic Eyes: both eyes with mildly injected conjunctivae; OD with + linear and horizontal fluoroscein uptake through center of cornea; no ciliary flush; no corneal ulceration; PERRLA; EOMI; eyelids normal bilaterally Neck: supple without LAD; FROM Lungs: clear to auscultation bilaterally; unlabored Heart: regular rate and rhythm Skin: warm and dry Neuro: CN 2-12 intact; normal gait Psychological: alert and cooperative; normal mood and affect  No Known Allergies  Past Medical History:  Diagnosis Date  . Chronic pain in left shoulder   . Hyperlipidemia   . Hypertension   . Leukocytosis 11/20/2013   referred to heamtology in 2015  . Polyarthralgia    referred to rheum in 20a5   Social History   Socioeconomic History  . Marital status: Married    Spouse name: Not on file  . Number of children: Not on file  . Years of education: Not on file  . Highest education level: Not on file  Occupational History  . Not on file  Social Needs  . Financial resource strain: Not on file  . Food insecurity:    Worry: Not on file    Inability: Not on file  . Transportation needs:    Medical: Not on file    Non-medical: Not on file  Tobacco Use  . Smoking status: Current Every Day Smoker    Packs/day: 1.00    Years: 19.00    Pack years: 19.00  . Smokeless tobacco: Never Used  . Tobacco comment: quit April 2006, smoked 1 1/2 pack/day  x 10 years  Substance and Sexual Activity  .  Alcohol use: No  . Drug use: No  . Sexual activity: Yes    Birth control/protection: None  Lifestyle  . Physical activity:    Days per week: Not on file    Minutes per session: Not on file  . Stress: Not on file  Relationships  . Social connections:    Talks on phone: Not on file    Gets together: Not on file    Attends religious service: Not on file    Active member of club or  organization: Not on file    Attends meetings of clubs or organizations: Not on file    Relationship status: Not on file  . Intimate partner violence:    Fear of current or ex partner: Not on file    Emotionally abused: Not on file    Physically abused: Not on file    Forced sexual activity: Not on file  Other Topics Concern  . Not on file  Social History Narrative   Reviewed history from 01/18/2007 and no changes required:         Married         Former Smoker -  quit April 2006.  smoked 1 1/2 pack/day x 10 years         Alcohol use-no         Drug use-no         Occupation:  Laboratory clinical tech    Family History  Problem Relation Age of Onset  . Hypertension Unknown   . Diabetes Unknown   . Coronary artery disease Unknown   . Cancer Maternal Grandmother        lung ca   Past Surgical History:  Procedure Laterality Date  . ANTERIOR CRUCIATE LIGAMENT REPAIR     left knee  . APPENDECTOMY    . FOOT SURGERY     screw right foot 5th metatarsal  . KNEE SURGERY     left knee ligaments repair     Mardella Layman, MD 10/18/17 1005

## 2019-10-09 ENCOUNTER — Other Ambulatory Visit: Payer: Self-pay

## 2020-07-02 ENCOUNTER — Encounter (HOSPITAL_BASED_OUTPATIENT_CLINIC_OR_DEPARTMENT_OTHER): Payer: Self-pay

## 2020-07-02 ENCOUNTER — Inpatient Hospital Stay (HOSPITAL_BASED_OUTPATIENT_CLINIC_OR_DEPARTMENT_OTHER)
Admission: EM | Admit: 2020-07-02 | Discharge: 2020-07-06 | DRG: 281 | Disposition: A | Discharge status: Home / Self Care | Payer: 59 | Attending: Internal Medicine | Admitting: Internal Medicine

## 2020-07-02 ENCOUNTER — Emergency Department (HOSPITAL_BASED_OUTPATIENT_CLINIC_OR_DEPARTMENT_OTHER): Payer: 59

## 2020-07-02 ENCOUNTER — Other Ambulatory Visit: Payer: Self-pay

## 2020-07-02 ENCOUNTER — Observation Stay (HOSPITAL_COMMUNITY): Payer: 59

## 2020-07-02 ENCOUNTER — Encounter (HOSPITAL_COMMUNITY)
Admission: EM | Disposition: A | Discharge status: Home / Self Care | Payer: Self-pay | Source: Home / Self Care | Attending: Internal Medicine

## 2020-07-02 DIAGNOSIS — E785 Hyperlipidemia, unspecified: Secondary | ICD-10-CM | POA: Diagnosis not present

## 2020-07-02 DIAGNOSIS — Z8249 Family history of ischemic heart disease and other diseases of the circulatory system: Secondary | ICD-10-CM | POA: Diagnosis not present

## 2020-07-02 DIAGNOSIS — E7800 Pure hypercholesterolemia, unspecified: Secondary | ICD-10-CM | POA: Diagnosis present

## 2020-07-02 DIAGNOSIS — N179 Acute kidney failure, unspecified: Secondary | ICD-10-CM | POA: Diagnosis not present

## 2020-07-02 DIAGNOSIS — R079 Chest pain, unspecified: Secondary | ICD-10-CM

## 2020-07-02 DIAGNOSIS — I214 Non-ST elevation (NSTEMI) myocardial infarction: Secondary | ICD-10-CM | POA: Diagnosis present

## 2020-07-02 DIAGNOSIS — I5032 Chronic diastolic (congestive) heart failure: Secondary | ICD-10-CM | POA: Diagnosis not present

## 2020-07-02 DIAGNOSIS — F17210 Nicotine dependence, cigarettes, uncomplicated: Secondary | ICD-10-CM | POA: Diagnosis present

## 2020-07-02 DIAGNOSIS — Z9189 Other specified personal risk factors, not elsewhere classified: Secondary | ICD-10-CM

## 2020-07-02 DIAGNOSIS — E860 Dehydration: Secondary | ICD-10-CM | POA: Diagnosis present

## 2020-07-02 DIAGNOSIS — Z79899 Other long term (current) drug therapy: Secondary | ICD-10-CM | POA: Diagnosis not present

## 2020-07-02 DIAGNOSIS — I160 Hypertensive urgency: Secondary | ICD-10-CM | POA: Diagnosis present

## 2020-07-02 DIAGNOSIS — I110 Hypertensive heart disease with heart failure: Secondary | ICD-10-CM | POA: Diagnosis not present

## 2020-07-02 DIAGNOSIS — G4733 Obstructive sleep apnea (adult) (pediatric): Secondary | ICD-10-CM | POA: Diagnosis not present

## 2020-07-02 DIAGNOSIS — R0683 Snoring: Secondary | ICD-10-CM | POA: Diagnosis not present

## 2020-07-02 DIAGNOSIS — Z9119 Patient's noncompliance with other medical treatment and regimen: Secondary | ICD-10-CM | POA: Diagnosis not present

## 2020-07-02 DIAGNOSIS — I200 Unstable angina: Secondary | ICD-10-CM

## 2020-07-02 DIAGNOSIS — I10 Essential (primary) hypertension: Secondary | ICD-10-CM

## 2020-07-02 DIAGNOSIS — Z20822 Contact with and (suspected) exposure to covid-19: Secondary | ICD-10-CM | POA: Diagnosis present

## 2020-07-02 DIAGNOSIS — D72825 Bandemia: Secondary | ICD-10-CM | POA: Diagnosis not present

## 2020-07-02 DIAGNOSIS — F17200 Nicotine dependence, unspecified, uncomplicated: Secondary | ICD-10-CM

## 2020-07-02 DIAGNOSIS — Z720 Tobacco use: Secondary | ICD-10-CM

## 2020-07-02 DIAGNOSIS — R7989 Other specified abnormal findings of blood chemistry: Secondary | ICD-10-CM

## 2020-07-02 LAB — LIPID PANEL
Cholesterol: 208 mg/dL — ABNORMAL HIGH (ref 0–200)
HDL: 32 mg/dL — ABNORMAL LOW (ref >40)
LDL Cholesterol: 155 mg/dL — ABNORMAL HIGH (ref 0–99)
Total CHOL/HDL Ratio: 6.5 RATIO
Triglycerides: 106 mg/dL (ref <150)
VLDL: 21 mg/dL (ref 0–40)

## 2020-07-02 LAB — TROPONIN I (HIGH SENSITIVITY)
Troponin I (High Sensitivity): 59 ng/L — ABNORMAL HIGH (ref <18)
Troponin I (High Sensitivity): 115 ng/L (ref <18)
Troponin I (High Sensitivity): 65 ng/L — ABNORMAL HIGH (ref <18)
Troponin I (High Sensitivity): 51 ng/L — ABNORMAL HIGH (ref <18)
Troponin I (High Sensitivity): 42 ng/L — ABNORMAL HIGH (ref <18)

## 2020-07-02 LAB — ECHOCARDIOGRAM COMPLETE
Area-P 1/2: 2.8 cm2
Height: 68 in
S' Lateral: 2.8 cm
Weight: 2800 oz

## 2020-07-02 LAB — BASIC METABOLIC PANEL
Anion gap: 11 (ref 5–15)
BUN: 23 mg/dL — ABNORMAL HIGH (ref 6–20)
CO2: 22 mmol/L (ref 22–32)
Calcium: 10.1 mg/dL (ref 8.9–10.3)
Chloride: 104 mmol/L (ref 98–111)
Creatinine, Ser: 1.42 mg/dL — ABNORMAL HIGH (ref 0.61–1.24)
GFR, Estimated: >60 mL/min (ref >60)
Glucose, Bld: 125 mg/dL — ABNORMAL HIGH (ref 70–99)
Potassium: 3.8 mmol/L (ref 3.5–5.1)
Sodium: 137 mmol/L (ref 135–145)

## 2020-07-02 LAB — HEMOGLOBIN A1C
Hgb A1c MFr Bld: 5.8 % — ABNORMAL HIGH (ref 4.8–5.6)
Mean Plasma Glucose: 119.76 mg/dL

## 2020-07-02 LAB — HIV ANTIBODY (ROUTINE TESTING W REFLEX): HIV Screen 4th Generation wRfx: NONREACTIVE

## 2020-07-02 LAB — CBC
HCT: 46.5 % (ref 39.0–52.0)
Hemoglobin: 16.1 g/dL (ref 13.0–17.0)
MCH: 30.2 pg (ref 26.0–34.0)
MCHC: 34.6 g/dL (ref 30.0–36.0)
MCV: 87.2 fL (ref 80.0–100.0)
Platelets: 303 10*3/uL (ref 150–400)
RBC: 5.33 MIL/uL (ref 4.22–5.81)
RDW: 13.2 % (ref 11.5–15.5)
WBC: 13.5 10*3/uL — ABNORMAL HIGH (ref 4.0–10.5)
nRBC: 0 % (ref 0.0–0.2)

## 2020-07-02 LAB — RESP PANEL BY RT-PCR (FLU A&B, COVID) ARPGX2
Influenza A by PCR: NEGATIVE
Influenza B by PCR: NEGATIVE
SARS Coronavirus 2 by RT PCR: NEGATIVE

## 2020-07-02 LAB — PROTIME-INR
INR: 0.9 (ref 0.8–1.2)
Prothrombin Time: 12.3 seconds (ref 11.4–15.2)

## 2020-07-02 LAB — HEPARIN LEVEL (UNFRACTIONATED): Heparin Unfractionated: 0.28 IU/mL — ABNORMAL LOW (ref 0.30–0.70)

## 2020-07-02 LAB — APTT: aPTT: 33 seconds (ref 24–36)

## 2020-07-02 SURGERY — LEFT HEART CATH AND CORONARY ANGIOGRAPHY
Anesthesia: LOCAL

## 2020-07-02 MED ORDER — METOPROLOL TARTRATE 25 MG PO TABS
25.0000 mg | ORAL_TABLET | Freq: Two times a day (BID) | ORAL | Status: DC
  Administered 2020-07-02 – 2020-07-03 (×2): 25 mg via ORAL
  Filled 2020-07-02 (×3): qty 1

## 2020-07-02 MED ORDER — HEPARIN (PORCINE) 25000 UT/250ML-% IV SOLN
1400.0000 [IU]/h | INTRAVENOUS | Status: DC
  Administered 2020-07-02: 950 [IU]/h via INTRAVENOUS
  Administered 2020-07-04: 1350 [IU]/h via INTRAVENOUS
  Administered 2020-07-05: 1400 [IU]/h via INTRAVENOUS
  Administered 2020-07-05: 1350 [IU]/h via INTRAVENOUS
  Filled 2020-07-02 (×5): qty 250

## 2020-07-02 MED ORDER — SODIUM CHLORIDE 0.9 % WEIGHT BASED INFUSION
3.0000 mL/kg/h | INTRAVENOUS | Status: AC
  Administered 2020-07-02: 3 mL/kg/h via INTRAVENOUS

## 2020-07-02 MED ORDER — ATORVASTATIN CALCIUM 80 MG PO TABS
80.0000 mg | ORAL_TABLET | Freq: Every day | ORAL | Status: DC
  Administered 2020-07-02 – 2020-07-06 (×5): 80 mg via ORAL
  Filled 2020-07-02 (×5): qty 1

## 2020-07-02 MED ORDER — SODIUM CHLORIDE 0.9% FLUSH
3.0000 mL | Freq: Two times a day (BID) | INTRAVENOUS | Status: DC
  Administered 2020-07-02 – 2020-07-03 (×2): 3 mL via INTRAVENOUS

## 2020-07-02 MED ORDER — ASPIRIN EC 325 MG PO TBEC
325.0000 mg | DELAYED_RELEASE_TABLET | Freq: Once | ORAL | Status: AC
  Administered 2020-07-02: 325 mg via ORAL
  Filled 2020-07-02: qty 1

## 2020-07-02 MED ORDER — SODIUM CHLORIDE 0.9% FLUSH: 3.0000 mL | INTRAVENOUS | Status: DC | PRN

## 2020-07-02 MED ORDER — NITROGLYCERIN IN D5W 200-5 MCG/ML-% IV SOLN
0.0000 ug/min | INTRAVENOUS | Status: DC
  Administered 2020-07-02: 5 ug/min via INTRAVENOUS
  Administered 2020-07-04: 20 ug/min via INTRAVENOUS
  Administered 2020-07-06: 10 ug/min via INTRAVENOUS
  Filled 2020-07-02 (×2): qty 250

## 2020-07-02 MED ORDER — ROSUVASTATIN CALCIUM 20 MG PO TABS: 20.0000 mg | ORAL_TABLET | Freq: Every day | ORAL | Status: DC

## 2020-07-02 MED ORDER — SODIUM CHLORIDE 0.9 % WEIGHT BASED INFUSION
1.0000 mL/kg/h | INTRAVENOUS | Status: DC
  Administered 2020-07-02 (×2): 1 mL/kg/h via INTRAVENOUS

## 2020-07-02 MED ORDER — ASPIRIN EC 81 MG PO TBEC
81.0000 mg | DELAYED_RELEASE_TABLET | Freq: Every day | ORAL | Status: DC
  Administered 2020-07-03 – 2020-07-05 (×3): 81 mg via ORAL
  Filled 2020-07-02 (×3): qty 1

## 2020-07-02 MED ORDER — NITROGLYCERIN 0.4 MG SL SUBL
0.4000 mg | SUBLINGUAL_TABLET | SUBLINGUAL | Status: DC | PRN
  Administered 2020-07-02: 0.4 mg via SUBLINGUAL
  Filled 2020-07-02: qty 1

## 2020-07-02 MED ORDER — ACETAMINOPHEN 325 MG PO TABS
650.0000 mg | ORAL_TABLET | ORAL | Status: DC | PRN
  Administered 2020-07-04 (×2): 650 mg via ORAL
  Filled 2020-07-02 (×2): qty 2

## 2020-07-02 MED ORDER — LABETALOL HCL 5 MG/ML IV SOLN
10.0000 mg | Freq: Once | INTRAVENOUS | Status: AC
  Administered 2020-07-02: 10 mg via INTRAVENOUS
  Filled 2020-07-02: qty 4

## 2020-07-02 MED ORDER — HEPARIN BOLUS VIA INFUSION
4000.0000 [IU] | Freq: Once | INTRAVENOUS | Status: AC
  Administered 2020-07-02: 4000 [IU] via INTRAVENOUS

## 2020-07-02 MED ORDER — LABETALOL HCL 5 MG/ML IV SOLN: 10.0000 mg | INTRAVENOUS | Status: DC | PRN

## 2020-07-02 MED ORDER — SODIUM CHLORIDE 0.9 % IV BOLUS
250.0000 mL | Freq: Once | INTRAVENOUS | Status: AC
  Administered 2020-07-02: 250 mL via INTRAVENOUS

## 2020-07-02 MED ORDER — ONDANSETRON HCL 4 MG/2ML IJ SOLN: 4.0000 mg | Freq: Four times a day (QID) | INTRAMUSCULAR | Status: DC | PRN

## 2020-07-02 MED ORDER — SODIUM CHLORIDE 0.9 % IV SOLN: 250.0000 mL | INTRAVENOUS | Status: DC | PRN

## 2020-07-02 MED ORDER — ASPIRIN 81 MG PO CHEW: 81.0000 mg | CHEWABLE_TABLET | ORAL | Status: AC

## 2020-07-02 MED ORDER — HYDRALAZINE HCL 20 MG/ML IJ SOLN
20.0000 mg | Freq: Once | INTRAMUSCULAR | Status: AC
  Administered 2020-07-02: 20 mg via INTRAVENOUS
  Filled 2020-07-02: qty 1

## 2020-07-02 NOTE — ED Notes (Signed)
Telephone report called to [place], RN, 6E, [facility].

## 2020-07-02 NOTE — ED Notes (Signed)
Report called to Tommy, RN with [facility] 

## 2020-07-02 NOTE — ED Provider Notes (Addendum)
[facility]   CSN: [id number] Arrival date & time: 07/02/20  0750     History Chief Complaint  Patient presents with   Chest Pain    Francisco Juarez is a 50 y.o. male.  Pt presents to the ED today with cp.  Pt said he woke up this morning with cp radiating down his left arm.  He said it took awhile, but it went away.  He got up and went to work.  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  The pt said he threw up twice, rested and it mostly went away.  Pt has a hx of high cholesterol and high blood pressure, but has not been back to the doctor in several years and has not been on meds.  He also smokes and has a family hx.     Past Medical History:  Diagnosis Date   Chronic pain in left shoulder    Hyperlipidemia    Hypertension    Leukocytosis 11/20/2013   referred to heamtology in 2015   Polyarthralgia    referred to rheum in 20a5    Patient Active Problem List   Diagnosis Date Noted   Hypertensive urgency    Tobacco abuse    NSTEMI (non-ST elevated myocardial infarction) (HCC) 07/03/2020   Chest pain 07/02/2020   Leukocytosis 11/20/2013   Smoking 11/20/2013   Erectile dysfunction 04/28/2011   Hyperlipemia 04/03/2007   DERMATITIS 04/03/2007    Past Surgical History:  Procedure Laterality Date   ANTERIOR CRUCIATE LIGAMENT REPAIR     left knee   APPENDECTOMY     FOOT SURGERY     screw right foot 5th metatarsal   INTRAVASCULAR PRESSURE WIRE/FFR STUDY N/A 07/06/2020   Procedure: INTRAVASCULAR PRESSURE WIRE/FFR STUDY;  Surgeon: Lyn RecordsSmith, Henry W, MD;  Location: [facility] INVASIVE CV LAB;  Service: Cardiovascular;  Laterality: N/A;   KNEE SURGERY     left knee ligaments repair   LEFT HEART CATH AND CORONARY ANGIOGRAPHY N/A 07/06/2020   Procedure: LEFT HEART CATH AND CORONARY ANGIOGRAPHY;  Surgeon: Lyn RecordsSmith, Henry W, MD;  Location: [facility] INVASIVE CV LAB;  Service: Cardiovascular;  Laterality: N/A;       Family  History  Problem Relation Age of Onset   Hypertension Unknown    Diabetes Unknown    Coronary artery disease Unknown    Cancer Maternal Grandmother        lung ca    Social History   Tobacco Use   Smoking status: Every Day    Packs/day: 1.00    Years: 19.00    Pack years: 19.00    Types: Cigarettes   Smokeless tobacco: Never   Tobacco comments:    quit April 2006, smoked 1 1/2 pack/day  x 10 years  Substance Use Topics   Alcohol use: No   Drug use: No    Home Medications Prior to Admission medications   Medication Sig Start Date End Date Taking? Authorizing Provider  losartan (COZAAR) 50 MG tablet Take 1 tablet (50 mg total) by mouth daily. 07/06/20 07/06/21 Yes Kathlen ModyAkula, Vijaya, MD  metoprolol succinate (TOPROL XL) 25 MG 24 hr tablet Take 1 tablet (25 mg total) by mouth daily. 07/06/20 07/06/21 Yes Kathlen ModyAkula, Vijaya, MD  aspirin EC 81 MG EC tablet Take 1 tablet (81 mg total) by mouth daily. Swallow whole. 07/07/20   Kathlen ModyAkula, Vijaya, MD  atorvastatin (LIPITOR) 80 MG tablet Take 1 tablet (80 mg total)  by mouth daily. 07/07/20   Kathlen Mody, MD  clopidogrel (PLAVIX) 75 MG tablet Take 1 tablet (75 mg total) by mouth daily. 07/07/20   Kathlen Mody, MD  nitroGLYCERIN (NITROSTAT) 0.4 MG SL tablet Place 1 tablet (0.4 mg total) under the tongue every 5 (five) minutes as needed for chest pain. 07/06/20   Kathlen Mody, MD  metoprolol tartrate (LOPRESSOR) 25 MG tablet Take 0.5 tablets (12.5 mg total) by mouth 2 (two) times daily. 07/06/20 07/06/20  Kathlen Mody, MD    Allergies    Patient has no known allergies.  Review of Systems   Review of Systems  Cardiovascular:  Positive for chest pain.  All other systems reviewed and are negative.  Physical Exam Updated Vital Signs BP 133/84 (BP Location: Left Arm)   Pulse (!) 55   Temp 98.7 F (37.1 C) (Oral)   Resp 12   Ht 5\' 8"  (1.727 m)   Wt 79.4 kg   SpO2 98%   BMI 26.61 kg/m   Physical Exam Vitals and nursing note reviewed.  Constitutional:       Appearance: He is well-developed.  HENT:     Head: Normocephalic and atraumatic.  Eyes:     Extraocular Movements: Extraocular movements intact.     Pupils: Pupils are equal, round, and reactive to light.  Cardiovascular:     Rate and Rhythm: Normal rate and regular rhythm.     Heart sounds: Normal heart sounds.  Pulmonary:     Effort: Pulmonary effort is normal.     Breath sounds: Normal breath sounds.  Abdominal:     General: Bowel sounds are normal.     Palpations: Abdomen is soft.  Musculoskeletal:        General: Normal range of motion.     Cervical back: Normal range of motion and neck supple.  Skin:    General: Skin is warm.     Capillary Refill: Capillary refill takes less than 2 seconds.  Neurological:     General: No focal deficit present.     Mental Status: He is alert and oriented to person, place, and time.  Psychiatric:        Behavior: Behavior normal.    ED Results / Procedures / Treatments   Labs (all labs ordered are listed, but only abnormal results are displayed) Labs Reviewed  BASIC METABOLIC PANEL - Abnormal; Notable for the following components:      Result Value   Glucose, Bld 125 (*)    BUN 23 (*)    Creatinine, Ser 1.42 (*)    All other components within normal limits  CBC - Abnormal; Notable for the following components:   WBC 13.5 (*)    All other components within normal limits  LIPID PANEL - Abnormal; Notable for the following components:   Cholesterol 208 (*)    HDL 32 (*)    LDL Cholesterol 155 (*)    All other components within normal limits  HEPARIN LEVEL (UNFRACTIONATED) - Abnormal; Notable for the following components:   Heparin Unfractionated 0.28 (*)    All other components within normal limits  HEMOGLOBIN A1C - Abnormal; Notable for the following components:   Hgb A1c MFr Bld 5.8 (*)    All other components within normal limits  HEPARIN LEVEL (UNFRACTIONATED) - Abnormal; Notable for the following components:   Heparin  Unfractionated 0.11 (*)    All other components within normal limits  RENAL FUNCTION PANEL - Abnormal; Notable for the following components:  Glucose, Bld 110 (*)    Calcium 8.7 (*)    Albumin 3.3 (*)    All other components within normal limits  HEPARIN LEVEL (UNFRACTIONATED) - Abnormal; Notable for the following components:   Heparin Unfractionated 0.23 (*)    All other components within normal limits  CBC - Abnormal; Notable for the following components:   WBC 12.9 (*)    All other components within normal limits  HEPARIN LEVEL (UNFRACTIONATED) - Abnormal; Notable for the following components:   Heparin Unfractionated <0.10 (*)    All other components within normal limits  BASIC METABOLIC PANEL - Abnormal; Notable for the following components:   Glucose, Bld 100 (*)    All other components within normal limits  CBC - Abnormal; Notable for the following components:   WBC 12.4 (*)    All other components within normal limits  HEPARIN LEVEL (UNFRACTIONATED) - Abnormal; Notable for the following components:   Heparin Unfractionated 0.29 (*)    All other components within normal limits  TROPONIN I (HIGH SENSITIVITY) - Abnormal; Notable for the following components:   Troponin I (High Sensitivity) 59 (*)    All other components within normal limits  TROPONIN I (HIGH SENSITIVITY) - Abnormal; Notable for the following components:   Troponin I (High Sensitivity) 115 (*)    All other components within normal limits  TROPONIN I (HIGH SENSITIVITY) - Abnormal; Notable for the following components:   Troponin I (High Sensitivity) 65 (*)    All other components within normal limits  TROPONIN I (HIGH SENSITIVITY) - Abnormal; Notable for the following components:   Troponin I (High Sensitivity) 51 (*)    All other components within normal limits  TROPONIN I (HIGH SENSITIVITY) - Abnormal; Notable for the following components:   Troponin I (High Sensitivity) 42 (*)    All other components  within normal limits  RESP PANEL BY RT-PCR (FLU A&B, COVID) ARPGX2  APTT  PROTIME-INR  HIV ANTIBODY (ROUTINE TESTING W REFLEX)  CBC  MAGNESIUM  HEPARIN LEVEL (UNFRACTIONATED)  CBC  HEPARIN LEVEL (UNFRACTIONATED)  CREATININE, SERUM  POCT ACTIVATED CLOTTING TIME    EKG EKG Interpretation  Date/Time:  Friday July 02 2020 08:07:03 EDT Ventricular Rate:  65 PR Interval:  164 QRS Duration: 89 QT Interval:  432 QTC Calculation: 450 R Axis:   8 Text Interpretation: Sinus rhythm Abnormal inferior Q waves No old tracing to compare Confirmed by Jacalyn Lefevre [phone]) on 07/02/2020 8:10:48 AM  Radiology No results found.  Procedures Procedures   Medications Ordered in ED Medications  aspirin chewable tablet 81 mg (81 mg Oral Not Given 07/03/20 0600)  0.9% sodium chloride infusion (3 mL/kg/hr  79.4 kg Intravenous New Bag/Given 07/02/20 1727)  labetalol (NORMODYNE) injection 10 mg (has no administration in time range)  hydrALAZINE (APRESOLINE) injection 10 mg (has no administration in time range)  0.9 %  sodium chloride infusion (0 mLs Intravenous Stopped 07/06/20 1410)  aspirin EC tablet 325 mg (325 mg Oral Given 07/02/20 0824)  labetalol (NORMODYNE) injection 10 mg (10 mg Intravenous Given 07/02/20 0914)  hydrALAZINE (APRESOLINE) injection 20 mg (20 mg Intravenous Given 07/02/20 0951)  heparin bolus via infusion 4,000 Units (4,000 Units Intravenous Bolus from Bag 07/02/20 1157)  sodium chloride 0.9 % bolus 250 mL (0 mLs Intravenous Stopped 07/02/20 1727)  heparin bolus via infusion 2,000 Units (2,000 Units Intravenous Bolus from Bag 07/03/20 0801)  aspirin chewable tablet 81 mg (81 mg Oral Given 07/06/20 0559)  clopidogrel (PLAVIX) tablet 300  mg (300 mg Oral Given 07/06/20 1628)    ED Course  I have reviewed the triage vital signs and the nursing notes.  Pertinent labs & imaging results that were available during my care of the patient were reviewed by me and considered in my medical decision  making (see chart for details).    MDM Rules/Calculators/A&P                          Pt's heart score is 5.  His cp is gone with nitro.  He has Q waves on EKG and troponin elevated at 59.  His bp is also elevated and is treated with labetalol.  He will need obs for further eval of his heart.  Pt d/w Dr. Arlyss Queen (triad) who will admit for obs.  CRITICAL CARE Performed by: Jacalyn Lefevre   Total critical care time: 30 minutes  Critical care time was exclusive of separately billable procedures and treating other patients.  Critical care was necessary to treat or prevent imminent or life-threatening deterioration.  Critical care was time spent personally by me on the following activities: development of treatment plan with patient and/or surrogate as well as nursing, discussions with consultants, evaluation of patient's response to treatment, examination of patient, obtaining history from patient or surrogate, ordering and performing treatments and interventions, ordering and review of laboratory studies, ordering and review of radiographic studies, pulse oximetry and re-evaluation of patient's condition.   Final Clinical Impression(s) / ED Diagnoses Final diagnoses:  Unstable angina (HCC)  Hypertensive urgency  Tobacco abuse    Rx / DC Orders ED Discharge Orders          Ordered    aspirin EC 81 MG EC tablet  Daily        07/06/20 1542    atorvastatin (LIPITOR) 80 MG tablet  Daily        07/06/20 1542    clopidogrel (PLAVIX) 75 MG tablet  Daily        07/06/20 1542    losartan (COZAAR) 50 MG tablet  Daily,   Status:  Discontinued        07/06/20 1542    metoprolol tartrate (LOPRESSOR) 25 MG tablet  2 times daily,   Status:  Discontinued        07/06/20 1542    nitroGLYCERIN (NITROSTAT) 0.4 MG SL tablet  Every 5 min PRN        07/06/20 1542    Increase activity slowly        07/06/20 1542    Diet - low sodium heart healthy        07/06/20 1542    Discharge instructions        Comments: Please follow up with cardiology in 2 weeks.   07/06/20 1542    Ambulatory referral to Cardiology        07/06/20 1542    metoprolol succinate (TOPROL XL) 25 MG 24 hr tablet  Daily        07/06/20 1607    losartan (COZAAR) 50 MG tablet  Daily        07/06/20 1607             Jacalyn Lefevre, MD 07/02/20 8144    Jacalyn Lefevre, MD 07/19/20 1523

## 2020-07-02 NOTE — ED Triage Notes (Signed)
Pt reports mid upper chest pain starting this am that got worse when he went to work.  Denies dizziness, lightheadedness and shortness of breath.  Reports nausea and 2 episodes of vomiting.

## 2020-07-02 NOTE — H&P (Signed)
History and Physical    Francisco Juarez ZOX:[id number] DOB: [birth year] DOA: 07/02/2020  PCP: Pcp, No Patient coming from: Home  Chief Complaint: Chest pain  HPI: Francisco Juarez is a 50 y.o. male with history of HTN and HLD not on meds, tobacco use disorder, noncompliance and possible OSA presenting with chest pain.  Patient woke up with chest pain about 5 AM this morning.  Chest pain was substernal.  He described the pain as indigestion, but also felt some numbness in his left arm.  He had similar indigestion symptom in the past that has improved with antacid but not at this time.  He had a couple of nonbilious nonbloody emesis as well.  Chest pain lasted about 20 to 30 minutes with resting or watching TV.  Then, he went to work.  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  Similar chest pain.  Pain did not go away that prompted him to go to ED.  Chest pain resolved with nitro in ED.  He is currently chest pain-free.  Patient denies previous exertional chest pain, shortness of breath, cough, fever, chills, palpitation, orthopnea, PND, edema or UTI symptoms.  Patient reports snoring.  Sleep study in 2013 with AHI of 2.9 and RDI of 3.6.  Denies new medications.  Has not taken blood pressure or cholesterol medication in 7 years.  No PCP.  Denies known COVID exposure.    No family history of heart disease.  Father died of drowning.  Mother and sister with HTN but no heart disease.  Patient lives with his wife.  Smokes about a pack a day.  Denies alcohol or recreational drug use.  Prefers to remain full code.  In ED, hypertensive to 198/113 but improved to 155/100 after labetalol and hydralazine push.  100% on room air.  Afebrile. Cr/BUN 1.45/23 (unknown baseline).  Troponin 59>> 115.  EKG normal sinus rhythm with Q waves but no acute ischemic finding abnormal interval.  WBC 13.5.  CXR, COVID-19 and influenza PCR negative.  Received full dose aspirin, IV hydralazine, IV labetalol,  and started on heparin infusion.  Transferred to [facility] for chest pain evaluation.  ROS All review of system negative except for pertinent positives and negatives as history of present illness above.  PMH Past Medical History:  Diagnosis Date   Chronic pain in left shoulder    Hyperlipidemia    Hypertension    Leukocytosis 11/20/2013   referred to heamtology in 2015   Polyarthralgia    referred to rheum in 20a5   [facility] Past Surgical History:  Procedure Laterality Date   ANTERIOR CRUCIATE LIGAMENT REPAIR     left knee   APPENDECTOMY     FOOT SURGERY     screw right foot 5th metatarsal   KNEE SURGERY     left knee ligaments repair   Fam HX Family History  Problem Relation Age of Onset   Hypertension Unknown    Diabetes Unknown    Coronary artery disease Unknown    Cancer Maternal Grandmother        lung ca    Social Hx  reports that he has been smoking. He has a 19.00 pack-year smoking history. He has never used smokeless tobacco. He reports that he does not drink alcohol and does not use drugs.  Allergy No Known Allergies Home Meds Prior to Admission medications   Medication Sig Start Date End Date Taking? Authorizing Provider  buPROPion [facility] SR) 150 MG  12 hr tablet Take 1 tablet (150 mg total) by mouth daily. 09/03/14   Terressa Koyanagi, DO  hydrochlorothiazide (HYDRODIURIL) 25 MG tablet Take 1 tablet (25 mg total) by mouth daily. 09/03/14   Terressa Koyanagi, DO  HYDROcodone-acetaminophen (NORCO/VICODIN) 5-325 MG tablet Take 1 tablet by mouth every 6 (six) hours as needed for moderate pain or severe pain. 10/17/17   Mardella Layman, MD  rosuvastatin (CRESTOR) 20 MG tablet Take 1 tablet (20 mg total) by mouth daily. 10/20/14   Terressa Koyanagi, DO    Physical Exam: Vitals:   07/02/20 1100 07/02/20 1200 07/02/20 1300 07/02/20 1533  BP:  (!) 157/99 (!) 141/99 (!) 155/100  Pulse:  75 68   Resp:  18 15 15   Temp:    98.6 F (37 C)  TempSrc:    Oral  SpO2:  99% 99% 100%   Weight: 79.4 kg     Height: 5\' 8"  (1.727 m)       GENERAL: No acute distress.  Appears well.  HEENT: MMM.  Vision and hearing grossly intact.  NECK: Supple.  No apparent JVD.  RESP: On room air.  No IWOB. Good air movement bilaterally. CVS:  RRR. Heart sounds normal.  ABD/GI/GU: Bowel sounds present. Soft. Non tender.  MSK/EXT:  Moves extremities. No apparent deformity or edema.  SKIN: no apparent skin lesion or wound NEURO: Awake, alert and oriented appropriately.  No gross deficit.  PSYCH: Calm. Normal affect.   Personally Reviewed Radiological Exams DG Chest Port 1 View  Result Date: 07/02/2020 CLINICAL DATA:  Chest pain EXAM: PORTABLE CHEST 1 VIEW COMPARISON:  05/30/2007 FINDINGS: The heart size and mediastinal contours are within normal limits. Both lungs are clear. The visualized skeletal structures are unremarkable. IMPRESSION: No active disease. Electronically Signed   By: 09/02/2020 M.D.   On: 07/02/2020 08:32     Personally Reviewed Labs: CBC: Recent Labs  Lab 07/02/20 0757  WBC 13.5*  HGB 16.1  HCT 46.5  MCV 87.2  PLT 303   Basic Metabolic Panel: Recent Labs  Lab 07/02/20 0757  NA 137  K 3.8  CL 104  CO2 22  GLUCOSE 125*  BUN 23*  CREATININE 1.42*  CALCIUM 10.1   GFR: Estimated Creatinine Clearance: 61.5 mL/min (A) (by C-G formula based on SCr of 1.42 mg/dL (H)). Liver Function Tests: No results for input(s): AST, ALT, ALKPHOS, BILITOT, PROT, ALBUMIN in the last 168 hours. No results for input(s): LIPASE, AMYLASE in the last 168 hours. No results for input(s): AMMONIA in the last 168 hours. Coagulation Profile: Recent Labs  Lab 07/02/20 1150  INR 0.9   Cardiac Enzymes: No results for input(s): CKTOTAL, CKMB, CKMBINDEX, TROPONINI in the last 168 hours. BNP (last 3 results) No results for input(s): PROBNP in the last 8760 hours. HbA1C: No results for input(s): HGBA1C in the last 72 hours. CBG: No results for input(s): GLUCAP in the last 168  hours. Lipid Profile: Recent Labs    07/02/20 0757  CHOL 208*  HDL 32*  LDLCALC 155*  TRIG 106  CHOLHDL 6.5   Thyroid Function Tests: No results for input(s): TSH, T4TOTAL, FREET4, T3FREE, THYROIDAB in the last 72 hours. Anemia Panel: No results for input(s): VITAMINB12, FOLATE, FERRITIN, TIBC, IRON, RETICCTPCT in the last 72 hours. Urine analysis:    Component Value Date/Time   COLORURINE YELLOW 05/18/2009 1135   APPEARANCEUR CLEAR 05/18/2009 1135   LABSPEC 1.020 05/18/2009 1135   PHURINE 5.0 05/18/2009 1135  GLUCOSEU NEGATIVE 05/18/2009 1135   HGBUR NEGATIVE 05/18/2009 1135   BILIRUBINUR n 12/02/2010 1142   KETONESUR NEGATIVE 05/18/2009 1135   PROTEINUR n 12/02/2010 1142   PROTEINUR NEGATIVE 05/18/2009 1135   UROBILINOGEN 0.2 12/02/2010 1142   UROBILINOGEN 0.2 05/18/2009 1135   NITRITE n 12/02/2010 1142   NITRITE NEGATIVE 05/18/2009 1135   LEUKOCYTESUR  12/02/2010 1142     Comment:     n    Sepsis Labs:  None  Personally Reviewed EKG:  Twelve-lead EKG normal sinus rhythm with Q waves but no acute ischemic finding or abnormal interval.  Assessment/Plan Chest pain/elevated troponin-concerning for non-STEMI. Significant risk factors including HTN, HLD, tobacco use, possible OSA.  Heart score 5-6.  Currently chest pain-free.  LDL 155.  Received full dose aspirin in ED.  Started on heparin drip. -Continue IV heparin -Cycle troponin. -Echocardiogram. -Nitro and labetalol as needed -Crestor 20 mg daily -Cardiology consulted -Check hemoglobin A1c -N.p.o. after midnight  Uncontrolled hypertension-history of hypertension but not on medication. -As needed labetalol as above  AKI/azotemia: Unknown baseline. Recent Labs    07/02/20 0757  BUN 23*  CREATININE 1.42*  -Continue monitoring  Hyperlipidemia: LDL 155. -Crestor as above  Leukocytosis: Likely demargination. -Recheck in the morning  At risk for sleep apnea-sleep study in 2013 with AHI of 2.9 and RDI  of 3.6.  Reports loud snoring to the extent of of waking himself up. -He may need another sleep study   DVT prophylaxis: On heparin for possible non-STEMI  Code Status: Full code Family Communication: Updated patient's wife over the phone with patient's permission  Disposition Plan: Admit to cardiac telemetry Consults called: Cardiology Admission status: Observation Level of care: Telemetry Cardiac   Almon Hercules MD [facility]  If 7PM-7AM, please contact night-coverage [email or web address]  07/02/2020, 4:10 PM

## 2020-07-02 NOTE — Progress Notes (Signed)
  Echocardiogram 2D Echocardiogram has been performed.  Delcie Roch 07/02/2020, 6:47 PM

## 2020-07-02 NOTE — ED Notes (Signed)
[facility] at bedside 

## 2020-07-02 NOTE — Progress Notes (Signed)
ANTICOAGULATION CONSULT NOTE - Initial Consult  Pharmacy Consult for heparin Indication: chest pain/ACS  No Known Allergies  Patient Measurements: Height: 5\' 8"  (172.7 cm) Weight: 79.4 kg (175 lb) IBW/kg (Calculated) : 68.4 Heparin Dosing Weight: 79.4 kg  Vital Signs: Temp: 97.6 F (36.4 C) (07/01 0806) Temp Source: Oral (07/01 0806) BP: 175/116 (07/01 0951) Pulse Rate: 56 (07/01 0934)  Labs: Recent Labs    07/02/20 0757 07/02/20 0954  HGB 16.1  --   HCT 46.5  --   PLT 303  --   CREATININE 1.42*  --   TROPONINIHS 59* 115*    Estimated Creatinine Clearance: 61.5 mL/min (A) (by C-G formula based on SCr of 1.42 mg/dL (H)).   Medical History: Past Medical History:  Diagnosis Date   Chronic pain in left shoulder    Hyperlipidemia    Hypertension    Leukocytosis 11/20/2013   referred to heamtology in 2015   Polyarthralgia    referred to rheum in 20a5   Assessment: 50 yo M with CP that is radiating down his left arm. Has HLD and HTN as baseline. Hx of smoking.   Goal of Therapy:  Heparin level 0.3-0.7 units/ml Monitor platelets by anticoagulation protocol: Yes   Plan:  Give 4000 units bolus x 1 Start heparin infusion at 950 units/hr -f/u HL @ 1800 on 7/1 -Monitor daily HL, CBC, and any s/sx of bleeding  9/1, PharmD, BCCCP Emergency Medicine Clinical Pharmacist  Please check AMION for all [facility] Pharm[facility] phone numbers After 10:00 PM, call Main Pharmacy [phone]

## 2020-07-02 NOTE — Consult Note (Signed)
Cardiology Consultation:   Patient ID: Francisco Juarez MRN: [id number]; DOB: 13-Mar-1970  Admit date: 07/02/2020 Date of Consult: 07/02/2020  PCP:  Oneita Hurt No   [facility] Providers Cardiologist:  New - Dr. Mayford Knife    Patient Profile:   Francisco Juarez is a 50 y.o. male with a hx of hypertension, hyperlipidemia and tobacco abuse who is being seen 07/02/2020 for the evaluation of chest pain with elevated troponin and hypertensive urgency at the request of Dr. Alanda Slim.  History of Present Illness:   Mr. Francisco Juarez is a 50 year old male with past medical history of hypertension, hyperlipidemia and tobacco abuse.  He has no prior cardiac history.  Due to financial reasons, he has not been followed by a primary care provider for years.  He has smoked since age 42 about 1 pack a day.  Last prescription for any of his medication was from 2016.  Recently, he has been having intermittent chest pain after waking up in the morning which he treated with antacid medication.  He works in [employer] and does a lot of strenuous activity.  He denies any recent chest discomfort with exertion prior to today.  This morning, he had very intense chest discomfort radiating down the left arm after waking up.  He thought it was his usual indigestion therefore took some antacid and the vomited afterward.  This made him feel better.  After he got to work, he had another episode of substernal chest pain radiating down the left arm with increased intensity and duration.  He sought medical attention at [facility].  Initial blood pressure was in the 190s systolic.  Symptom improved with nitroglycerin in the ED.  He was treated with IV hydralazine and IV labetalol.  Blood pressure improved to 150s.  Patient was subsequently transferred to [facility] for hypertensive urgency.  Initial EKG shows sinus rhythm without significant ST-T wave changes, possible Q waves in the inferior leads.  High-sensitivity troponin was 59--> 115.   LDL was 155.  White blood cell count borderline elevated at 13.5.  Creatinine 1.42.  Chest x-ray showed no active disease.  Cardiology has been consulted for chest pain with elevated troponin and also hypertensive urgency.  Patient is chest pain-free at this time.   Past Medical History:  Diagnosis Date   Chronic pain in left shoulder    Hyperlipidemia    Hypertension    Leukocytosis 11/20/2013   referred to heamtology in 2015   Polyarthralgia    referred to rheum in 20a5    Past Surgical History:  Procedure Laterality Date   ANTERIOR CRUCIATE LIGAMENT REPAIR     left knee   APPENDECTOMY     FOOT SURGERY     screw right foot 5th metatarsal   KNEE SURGERY     left knee ligaments repair     Home Medications:  Prior to Admission medications   Medication Sig Start Date End Date Taking? Authorizing Provider  ibuprofen (ADVIL) 200 MG tablet Take 600 mg by mouth every 6 (six) hours as needed for headache (pain).   Yes [provider]  buPROPion (WELLBUTRIN SR) 150 MG 12 hr tablet Take 1 tablet (150 mg total) by mouth daily. Patient not taking: Reported on 07/02/2020 09/03/14   Terressa Koyanagi, DO  hydrochlorothiazide (HYDRODIURIL) 25 MG tablet Take 1 tablet (25 mg total) by mouth daily. Patient not taking: Reported on 07/02/2020 09/03/14   Terressa Koyanagi, DO  rosuvastatin (CRESTOR) 20 MG tablet Take 1 tablet (20 mg  total) by mouth daily. Patient not taking: Reported on 07/02/2020 10/20/14   Terressa Koyanagi, DO    Inpatient Medications: Scheduled Meds:  [START ON 07/03/2020] aspirin  81 mg Oral Pre-Cath   [START ON 07/03/2020] aspirin EC  81 mg Oral Daily   atorvastatin  80 mg Oral Daily   metoprolol tartrate  25 mg Oral BID   sodium chloride flush  3 mL Intravenous Q12H   Continuous Infusions:  sodium chloride     sodium chloride     Followed by   sodium chloride     heparin 950 Units/hr (07/02/20 1157)   PRN Meds: sodium chloride, acetaminophen, labetalol, nitroGLYCERIN,  ondansetron (ZOFRAN) IV, sodium chloride flush  Allergies:   No Known Allergies  Social History:   Social History   Socioeconomic History   Marital status: Married    Spouse name: Not on file   Number of children: Not on file   Years of education: Not on file   Highest education level: Not on file  Occupational History   Not on file  Tobacco Use   Smoking status: Every Day    Packs/day: 1.00    Years: 19.00    Pack years: 19.00    Types: Cigarettes   Smokeless tobacco: Never   Tobacco comments:    quit April 2006, smoked 1 1/2 pack/day  x 10 years  Substance and Sexual Activity   Alcohol use: No   Drug use: No   Sexual activity: Yes    Birth control/protection: None  Other Topics Concern   Not on file  Social History Narrative   Reviewed history from 01/18/2007 and no changes required:         Married         Former Smoker -  quit April 2006.  smoked 1 1/2 pack/day x 10 years         Alcohol use-no         Drug use-no         Occupation:  Laboratory clinical tech    Social Determinants of Corporate investment banker Strain: Not on file  Food Insecurity: Not on file  Transportation Needs: Not on file  Physical Activity: Not on file  Stress: Not on file  Social Connections: Not on file  Intimate Partner Violence: Not on file    Family History:    Family History  Problem Relation Age of Onset   Hypertension Unknown    Diabetes Unknown    Coronary artery disease Unknown    Cancer Maternal Grandmother        lung ca     ROS:  Please see the history of present illness.   All other ROS reviewed and negative.     Physical Exam/Data:   Vitals:   07/02/20 1100 07/02/20 1200 07/02/20 1300 07/02/20 1533  BP:  (!) 157/99 (!) 141/99 (!) 155/100  Pulse:  75 68   Resp:  18 15 15   Temp:    98.6 F (37 C)  TempSrc:    Oral  SpO2:  99% 99% 100%  Weight: 79.4 kg     Height: 5\' 8"  (1.727 m)       Intake/Output Summary (Last 24 hours) at 07/02/2020 1718 Last  data filed at 07/02/2020 1601 Gross per 24 hour  Intake 78.17 ml  Output --  Net 78.17 ml   Last 3 Weights 07/02/2020 07/02/2020 10/14/2014  Weight (lbs) 175 lb 175 lb 176 lb 3.2 oz  Weight (kg) 79.379 kg 79.379 kg 79.924 kg     Body mass index is 26.61 kg/m.  General:  Well nourished, well developed, in no acute distress HEENT: normal Lymph: no adenopathy Neck: no JVD Endocrine:  No thryomegaly Vascular: No carotid bruits; FA pulses 2+ bilaterally without bruits  Cardiac:  normal S1, S2; RRR; no murmur  Lungs:  clear to auscultation bilaterally, no wheezing, rhonchi or rales  Abd: soft, nontender, no hepatomegaly  Ext: no edema Musculoskeletal:  No deformities, BUE and BLE strength normal and equal Skin: warm and dry  Neuro:  CNs 2-12 intact, no focal abnormalities noted Psych:  Normal affect   EKG:  The EKG was personally reviewed and demonstrates: Normal sinus rhythm, no significant ST-T wave changes, minimal Q waves in the inferior lead. Telemetry:  Telemetry was personally reviewed and demonstrates: Normal sinus rhythm, no significant ventricular ectopy  Relevant CV Studies:  N/A  Laboratory Data:  High Sensitivity Troponin:   Recent Labs  Lab 07/02/20 0757 07/02/20 0954  TROPONINIHS 59* 115*     Chemistry Recent Labs  Lab 07/02/20 0757  NA 137  K 3.8  CL 104  CO2 22  GLUCOSE 125*  BUN 23*  CREATININE 1.42*  CALCIUM 10.1  GFRNONAA >60  ANIONGAP 11    No results for input(s): PROT, ALBUMIN, AST, ALT, ALKPHOS, BILITOT in the last 168 hours. Hematology Recent Labs  Lab 07/02/20 0757  WBC 13.5*  RBC 5.33  HGB 16.1  HCT 46.5  MCV 87.2  MCH 30.2  MCHC 34.6  RDW 13.2  PLT 303   BNPNo results for input(s): BNP, PROBNP in the last 168 hours.  DDimer No results for input(s): DDIMER in the last 168 hours.   Radiology/Studies:  DG Chest Port 1 View  Result Date: 07/02/2020 CLINICAL DATA:  Chest pain EXAM: PORTABLE CHEST 1 VIEW COMPARISON:  05/30/2007  FINDINGS: The heart size and mediastinal contours are within normal limits. Both lungs are clear. The visualized skeletal structures are unremarkable. IMPRESSION: No active disease. Electronically Signed   By: Alcide Clever M.D.   On: 07/02/2020 08:32     Assessment and Plan:   NSTEMI:   -Discussed with MD, although elevation of the blood pressure can cause elevated troponin, however initial troponin slowly trending up.  His symptom is concerning as well with 2 episodes of chest pain this morning.  -We plan to see if the patient can be added to the cath for today.  If not, we will need IV heparin and potentially consider cath on Tuesday.  -Started on aspirin, metoprolol 25 mg twice a day, Lipitor 80 mg daily  -Obtain echocardiogram   Hypertensive urgency: Blood pressure elevated.  We will start on metoprolol 25 mg twice a day.  Based on blood pressure, will continue to uptitrate medication.  Hyperlipidemia: Start on Lipitor 80 mg daily  AKI: Aggressive IV hydration.  It is possible patient is dehydrated today while walking outside.  Poor compliance: Patient says he has not seen his PCP nor took any blood pressure medication or cholesterol medication since 2016 due to cost reason.    Risk Assessment/Risk Scores:     TIMI Risk Score for Unstable Angina or Non-ST Elevation MI:   The patient's TIMI risk score is 2, which indicates a 8% risk of all cause mortality, new or recurrent myocardial infarction or need for urgent revascularization in the next 14 days.          For questions or updates,  please contact [facility] Please consult [email or web address] for contact info under    Ramond DialSigned, Dakotah Orrego, [place]  07/02/2020 5:18 PM

## 2020-07-03 DIAGNOSIS — N179 Acute kidney failure, unspecified: Secondary | ICD-10-CM | POA: Diagnosis present

## 2020-07-03 DIAGNOSIS — I2510 Atherosclerotic heart disease of native coronary artery without angina pectoris: Secondary | ICD-10-CM | POA: Diagnosis not present

## 2020-07-03 DIAGNOSIS — E7800 Pure hypercholesterolemia, unspecified: Secondary | ICD-10-CM | POA: Diagnosis present

## 2020-07-03 DIAGNOSIS — I214 Non-ST elevation (NSTEMI) myocardial infarction: Secondary | ICD-10-CM | POA: Diagnosis present

## 2020-07-03 DIAGNOSIS — E860 Dehydration: Secondary | ICD-10-CM | POA: Diagnosis present

## 2020-07-03 DIAGNOSIS — Z8249 Family history of ischemic heart disease and other diseases of the circulatory system: Secondary | ICD-10-CM | POA: Diagnosis not present

## 2020-07-03 DIAGNOSIS — Z20822 Contact with and (suspected) exposure to covid-19: Secondary | ICD-10-CM | POA: Diagnosis present

## 2020-07-03 DIAGNOSIS — I160 Hypertensive urgency: Secondary | ICD-10-CM | POA: Diagnosis present

## 2020-07-03 DIAGNOSIS — Z79899 Other long term (current) drug therapy: Secondary | ICD-10-CM | POA: Diagnosis not present

## 2020-07-03 DIAGNOSIS — I10 Essential (primary) hypertension: Secondary | ICD-10-CM | POA: Diagnosis not present

## 2020-07-03 DIAGNOSIS — Z720 Tobacco use: Secondary | ICD-10-CM | POA: Diagnosis not present

## 2020-07-03 DIAGNOSIS — R0683 Snoring: Secondary | ICD-10-CM | POA: Diagnosis present

## 2020-07-03 DIAGNOSIS — I5032 Chronic diastolic (congestive) heart failure: Secondary | ICD-10-CM | POA: Diagnosis present

## 2020-07-03 DIAGNOSIS — F17210 Nicotine dependence, cigarettes, uncomplicated: Secondary | ICD-10-CM | POA: Diagnosis present

## 2020-07-03 DIAGNOSIS — G4733 Obstructive sleep apnea (adult) (pediatric): Secondary | ICD-10-CM | POA: Diagnosis present

## 2020-07-03 DIAGNOSIS — I110 Hypertensive heart disease with heart failure: Secondary | ICD-10-CM | POA: Diagnosis present

## 2020-07-03 DIAGNOSIS — E785 Hyperlipidemia, unspecified: Secondary | ICD-10-CM | POA: Diagnosis present

## 2020-07-03 LAB — RENAL FUNCTION PANEL
Albumin: 3.3 g/dL — ABNORMAL LOW (ref 3.5–5.0)
Anion gap: 9 (ref 5–15)
BUN: 13 mg/dL (ref 6–20)
CO2: 22 mmol/L (ref 22–32)
Calcium: 8.7 mg/dL — ABNORMAL LOW (ref 8.9–10.3)
Chloride: 107 mmol/L (ref 98–111)
Creatinine, Ser: 1.08 mg/dL (ref 0.61–1.24)
GFR, Estimated: >60 mL/min (ref >60)
Glucose, Bld: 110 mg/dL — ABNORMAL HIGH (ref 70–99)
Phosphorus: 3.6 mg/dL (ref 2.5–4.6)
Potassium: 3.8 mmol/L (ref 3.5–5.1)
Sodium: 138 mmol/L (ref 135–145)

## 2020-07-03 LAB — CBC
HCT: 41.7 % (ref 39.0–52.0)
Hemoglobin: 14.4 g/dL (ref 13.0–17.0)
MCH: 30.3 pg (ref 26.0–34.0)
MCHC: 34.5 g/dL (ref 30.0–36.0)
MCV: 87.8 fL (ref 80.0–100.0)
Platelets: 267 10*3/uL (ref 150–400)
RBC: 4.75 MIL/uL (ref 4.22–5.81)
RDW: 13.2 % (ref 11.5–15.5)
WBC: 10.5 10*3/uL (ref 4.0–10.5)
nRBC: 0 % (ref 0.0–0.2)

## 2020-07-03 LAB — MAGNESIUM: Magnesium: 2.1 mg/dL (ref 1.7–2.4)

## 2020-07-03 LAB — HEPARIN LEVEL (UNFRACTIONATED)
Heparin Unfractionated: 0.11 IU/mL — ABNORMAL LOW (ref 0.30–0.70)
Heparin Unfractionated: 0.3 IU/mL (ref 0.30–0.70)

## 2020-07-03 MED ORDER — HEPARIN BOLUS VIA INFUSION
2000.0000 [IU] | Freq: Once | INTRAVENOUS | Status: AC
  Administered 2020-07-03: 2000 [IU] via INTRAVENOUS
  Filled 2020-07-03: qty 2000

## 2020-07-03 MED ORDER — SODIUM CHLORIDE 0.9% FLUSH
3.0000 mL | Freq: Two times a day (BID) | INTRAVENOUS | Status: DC
  Administered 2020-07-03 – 2020-07-04 (×2): 3 mL via INTRAVENOUS

## 2020-07-03 MED ORDER — METOPROLOL TARTRATE 12.5 MG HALF TABLET
12.5000 mg | ORAL_TABLET | Freq: Two times a day (BID) | ORAL | Status: DC
  Administered 2020-07-03 – 2020-07-06 (×6): 12.5 mg via ORAL
  Filled 2020-07-03 (×6): qty 1

## 2020-07-03 NOTE — Progress Notes (Addendum)
ANTICOAGULATION CONSULT NOTE - Initial Consult  Pharmacy Consult for heparin Indication: chest pain/ACS  No Known Allergies  Patient Measurements: Height: 5\' 8"  (172.7 cm) Weight: 79.4 kg (175 lb) IBW/kg (Calculated) : 68.4 Heparin Dosing Weight: 79.4 kg  Vital Signs: Temp: 98.4 F (36.9 C) (07/02 0300) Temp Source: Oral (07/02 0300) BP: 118/71 (07/02 0600) Pulse Rate: 58 (07/02 0600)  Labs: Recent Labs    07/02/20 0757 07/02/20 0954 07/02/20 1150 07/02/20 1648 07/02/20 1650 07/02/20 1740 07/02/20 2000 07/03/20 0224  HGB 16.1  --   --   --   --   --   --  14.4  HCT 46.5  --   --   --   --   --   --  41.7  PLT 303  --   --   --   --   --   --  267  APTT  --   --  33  --   --   --   --   --   LABPROT  --   --  12.3  --   --   --   --   --   INR  --   --  0.9  --   --   --   --   --   HEPARINUNFRC  --   --   --  0.28*  --   --   --  0.11*  CREATININE 1.42*  --   --   --   --   --   --  1.08  TROPONINIHS 59*   < >  --   --  65* 51* 42*  --    < > = values in this interval not displayed.     Estimated Creatinine Clearance: 80.9 mL/min (by C-G formula based on SCr of 1.08 mg/dL).   Medical History: Past Medical History:  Diagnosis Date   Chronic pain in left shoulder    Hyperlipidemia    Hypertension    Leukocytosis 11/20/2013   referred to heamtology in 2015   Polyarthralgia    referred to rheum in 20a5   Assessment: 50 yo M with CP that is radiating down his left arm. Has HLD and HTN as baseline. Hx of smoking. No anticoagulation prior to admission. Pharmacy consulted for heparin.    HL down to 0.11, likely due to bolus wearing off.  No issues with infusion or bleeding per RN.   Goal of Therapy:  Heparin level 0.3-0.7 units/ml Monitor platelets by anticoagulation protocol: Yes   Plan:  Give another 2000 unit bolus  Increase heparin to 1100 units/hr F/u 6hr HL Monitor daily HL, CBC/plt Monitor for signs/symptoms of bleeding  F/u Cath   15:35  ADDENDUM: HL back at 0.3 after bolus. Will increase to 1200 units/hr to keep in goal      52, PharmD, BCPS, [facility] Clinical Pharmacist  Please check AMION for all [facility] Pharmacy phone numbers After 10:00 PM, call Main Pharmacy [phone]

## 2020-07-03 NOTE — Progress Notes (Signed)
Progress Note  Patient Name: Francisco Juarez Date of Encounter: 07/03/2020  [facility] Cardiologist: Dr Mayford Knife  Subjective   No CP or dyspnea  Inpatient Medications    Scheduled Meds:  aspirin  81 mg Oral Pre-Cath   aspirin EC  81 mg Oral Daily   atorvastatin  80 mg Oral Daily   metoprolol tartrate  25 mg Oral BID   sodium chloride flush  3 mL Intravenous Q12H   Continuous Infusions:  sodium chloride     sodium chloride 1 mL/kg/hr (07/02/20 2316)   heparin 950 Units/hr (07/02/20 1157)   nitroGLYCERIN 5 mcg/min (07/02/20 2337)   PRN Meds: sodium chloride, acetaminophen, labetalol, nitroGLYCERIN, ondansetron (ZOFRAN) IV, sodium chloride flush   Vital Signs    Vitals:   07/02/20 2200 07/02/20 2300 07/03/20 0300 07/03/20 0600  BP: 117/77 108/78 118/70 118/71  Pulse: 79 65 (!) 58 (!) 58  Resp:  14 15 (!) 21  Temp:  98.6 F (37 C) 98.4 F (36.9 C)   TempSrc:  Oral Oral   SpO2:  98% 98% 98%  Weight:      Height:        Intake/Output Summary (Last 24 hours) at 07/03/2020 0727 Last data filed at 07/02/2020 1758 Gross per 24 hour  Intake 453.09 ml  Output --  Net 453.09 ml   Last 3 Weights 07/02/2020 07/02/2020 10/14/2014  Weight (lbs) 175 lb 175 lb 176 lb 3.2 oz  Weight (kg) 79.379 kg 79.379 kg 79.924 kg      Telemetry    Sinus- Personally Reviewed   Physical Exam   GEN: No acute distress.   Neck: No JVD Cardiac: RRR, no murmurs, rubs, or gallops.  Respiratory: Clear to auscultation bilaterally. GI: Soft, nontender, non-distended  MS: No edema Neuro:  Nonfocal  Psych: Normal affect   Labs    High Sensitivity Troponin:   Recent Labs  Lab 07/02/20 0757 07/02/20 0954 07/02/20 1650 07/02/20 1740 07/02/20 2000  TROPONINIHS 59* 115* 65* 51* 42*      Chemistry Recent Labs  Lab 07/02/20 0757 07/03/20 0224  NA 137 138  K 3.8 3.8  CL 104 107  CO2 22 22  GLUCOSE 125* 110*  BUN 23* 13  CREATININE 1.42* 1.08  CALCIUM 10.1 8.7*  ALBUMIN  --  3.3*   GFRNONAA >60 >60  ANIONGAP 11 9     Hematology Recent Labs  Lab 07/02/20 0757 07/03/20 0224  WBC 13.5* 10.5  RBC 5.33 4.75  HGB 16.1 14.4  HCT 46.5 41.7  MCV 87.2 87.8  MCH 30.2 30.3  MCHC 34.6 34.5  RDW 13.2 13.2  PLT 303 267     Radiology    DG Chest Port 1 View  Result Date: 07/02/2020 CLINICAL DATA:  Chest pain EXAM: PORTABLE CHEST 1 VIEW COMPARISON:  05/30/2007 FINDINGS: The heart size and mediastinal contours are within normal limits. Both lungs are clear. The visualized skeletal structures are unremarkable. IMPRESSION: No active disease. Electronically Signed   By: Alcide Clever M.D.   On: 07/02/2020 08:32   ECHOCARDIOGRAM COMPLETE  Result Date: 07/02/2020    ECHOCARDIOGRAM REPORT   Patient Name:   Francisco Juarez Date of Exam: 07/02/2020 Medical Rec #:  [id number]    Height:       68.0 in Accession #:    [id number]   Weight:       175.0 lb Date of Birth:  10/17/1970    BSA:  1.931 m Patient Age:    49 years     BP:           155/100 mmHg Patient Gender: M            HR:           66 bpm. Exam Location:  Inpatient Procedure: 2D Echo STAT ECHO Indications:    Chest pain  History:        Patient has no prior history of Echocardiogram examinations.                 Signs/Symptoms:Chest Pain; Risk Factors:Current Smoker and                 Dyslipidemia.  Sonographer:    Delcie Roch Referring Phys: [id number] HAO MENG IMPRESSIONS  1. Left ventricular ejection fraction, by estimation, is 60 to 65%. The left ventricle has normal function. The left ventricle has no regional wall motion abnormalities. Left ventricular diastolic parameters were normal.  2. Right ventricular systolic function is normal. The right ventricular size is normal. Tricuspid regurgitation signal is inadequate for assessing PA pressure.  3. The mitral valve is normal in structure. Trivial mitral valve regurgitation. No evidence of mitral stenosis.  4. The aortic valve is normal in structure. Aortic valve  regurgitation is not visualized. No aortic stenosis is present.  5. The inferior vena cava is normal in size with greater than 50% respiratory variability, suggesting right atrial pressure of 3 mmHg. FINDINGS  Left Ventricle: Left ventricular ejection fraction, by estimation, is 60 to 65%. The left ventricle has normal function. The left ventricle has no regional wall motion abnormalities. The left ventricular internal cavity size was normal in size. There is  no left ventricular hypertrophy. Left ventricular diastolic parameters were normal. Normal left ventricular filling pressure. Right Ventricle: The right ventricular size is normal. No increase in right ventricular wall thickness. Right ventricular systolic function is normal. Tricuspid regurgitation signal is inadequate for assessing PA pressure. Left Atrium: Left atrial size was normal in size. Right Atrium: Right atrial size was normal in size. Pericardium: There is no evidence of pericardial effusion. Mitral Valve: The mitral valve is normal in structure. Trivial mitral valve regurgitation. No evidence of mitral valve stenosis. Tricuspid Valve: The tricuspid valve is normal in structure. Tricuspid valve regurgitation is not demonstrated. No evidence of tricuspid stenosis. Aortic Valve: The aortic valve is normal in structure. Aortic valve regurgitation is not visualized. No aortic stenosis is present. Pulmonic Valve: The pulmonic valve was normal in structure. Pulmonic valve regurgitation is not visualized. No evidence of pulmonic stenosis. Aorta: The aortic root is normal in size and structure. Venous: The inferior vena cava was not well visualized. The inferior vena cava is normal in size with greater than 50% respiratory variability, suggesting right atrial pressure of 3 mmHg. IAS/Shunts: No atrial level shunt detected by color flow Doppler.  LEFT VENTRICLE PLAX 2D LVIDd:         4.50 cm  Diastology LVIDs:         2.80 cm  LV e' medial:    8.92 cm/s LV  PW:         0.90 cm  LV E/e' medial:  7.9 LV IVS:        0.90 cm  LV e' lateral:   12.40 cm/s LVOT diam:     1.80 cm  LV E/e' lateral: 5.7 LV SV:         62 LV SV Index:  32 LVOT Area:     2.54 cm  RIGHT VENTRICLE             IVC RV S prime:     12.80 cm/s  IVC diam: 1.60 cm TAPSE (M-mode): 2.7 cm LEFT ATRIUM             Index       RIGHT ATRIUM           Index LA diam:        4.00 cm 2.07 cm/m  RA Area:     15.60 cm LA Vol (A2C):   44.0 ml 22.79 ml/m RA Volume:   40.30 ml  20.87 ml/m LA Vol (A4C):   39.6 ml 20.51 ml/m LA Biplane Vol: 42.4 ml 21.96 ml/m  AORTIC VALVE LVOT Vmax:   121.00 cm/s LVOT Vmean:  78.100 cm/s LVOT VTI:    0.244 m  AORTA Ao Root diam: 2.90 cm Ao Asc diam:  3.40 cm MITRAL VALVE MV Area (PHT): 2.80 cm    SHUNTS MV Decel Time: 271 msec    Systemic VTI:  0.24 m MV E velocity: 70.70 cm/s  Systemic Diam: 1.80 cm MV A velocity: 67.10 cm/s MV E/A ratio:  1.05 Armanda Magic MD Electronically signed by Armanda Magic MD Signature Date/Time: 07/02/2020/6:52:51 PM    Final      Patient Profile     50 y.o. male with past medical history of hypertension, hyperlipidemia, tobacco abuse admitted with non-ST elevation myocardial infarction.  Echocardiogram shows normal LV function.  Assessment & Plan    1 non-ST elevation myocardial infarction-patient is pain-free this morning.  Symptoms at time of presentation concerning.  Continue aspirin, heparin, NTG, metoprolol and statin.  Troponin increased from initial of 59 to 115 and then trended down.  Plan will be cardiac catheterization.  We will proceed on Tuesday or sooner if he develops recurrent symptoms.  The risks and benefits including myocardial infarction, CVA and death discussed and he agrees to proceed.  2 hyperlipidemia-continue Lipitor at present dose.  Check lipids and liver in 12 weeks.  3 hypertension-blood pressure is controlled.  We will follow on metoprolol and adjust as needed.  4 tobacco abuse-patient counseled on  discontinuing.  For questions or updates, please contact [facility] Please consult [email or web address] for contact info under        Signed, Olga Millers, MD  07/03/2020, 7:27 AM  ]

## 2020-07-03 NOTE — Progress Notes (Signed)
PROGRESS NOTE    Francisco Juarez  GNF:[id number] DOB: 09-15-1970 DOA: 07/02/2020 PCP: Pcp, No   Chief Complaint  Patient presents with   Chest Pain    Brief Narrative:   Francisco Juarez is a 50 y.o. male with history of HTN and HLD not on meds, tobacco use disorder, noncompliance and possible OSA presenting with chest pain.  In ED, hypertensive to 198/113 but improved to 155/100 after labetalol and hydralazine push.  100% on room air.  Afebrile. Cr/BUN 1.45/23 (unknown baseline).  Troponin 59>> 115.  EKG normal sinus rhythm with Q waves but no acute ischemic finding abnormal interval.  WBC 13.5.  CXR, COVID-19 and influenza PCR negative.  Received full dose aspirin, IV hydralazine, IV labetalol, and started on heparin infusion.  Transferred to [facility] for chest pain evaluation. Pt admitted for NSTEMI. Cardiology consulted.    Assessment & Plan:   Active Problems:   Chest pain  NSTEMI:  Chest pain free.  Continue with aspirin, metoprolol, NTG gtt and heparin gtt.  Cardiology consulted, plan for cath next week.    Hypertension:  Well controlled.    Hyperlipidemia:  Continue with lipitor.    Tobacco abuse;  Counseled on cessation.   AKI:  Probably from dehydration Creatinine improved with hydration.         DVT prophylaxis: (Heparin Code Status: (Full code) Family Communication: none at bedside.  Disposition:   Status is: Observation  The patient will require care spanning > 2 midnights and should be moved to inpatient because: Ongoing diagnostic testing needed not appropriate for outpatient work up  Dispo: The patient is from: Home              Anticipated d/c is to: Home              Patient currently is not medically stable to d/c.   Difficult to place patient No       Consultants:  Cardiology.   Procedures: (none.   Antimicrobials: none.    Subjective: No chest pain or sob.   Objective: Vitals:   07/03/20 0300 07/03/20 0600 07/03/20 0819 07/03/20  1214  BP: 118/70 118/71 (!) 133/100 136/87  Pulse: (!) 58 (!) 58 61 (!) 51  Resp: 15 (!) 21 16 16   Temp: 98.4 F (36.9 C)  98 F (36.7 C) 98.7 F (37.1 C)  TempSrc: Oral  Oral Oral  SpO2: 98% 98% 99% 96%  Weight:      Height:        Intake/Output Summary (Last 24 hours) at 07/03/2020 1314 Last data filed at 07/03/2020 0820 Gross per 24 hour  Intake 453.09 ml  Output 300 ml  Net 153.09 ml   Filed Weights   07/02/20 0757 07/02/20 1100  Weight: 79.4 kg 79.4 kg    Examination:  General exam: Appears calm and comfortable  Respiratory system: Clear to auscultation. Respiratory effort normal. Cardiovascular system: S1 & S2 heard, RRR. No JVD, No pedal edema. Gastrointestinal system: Abdomen is nondistended, soft and nontender.  Normal bowel sounds heard. Central nervous system: Alert and oriented. No focal neurological deficits. Extremities: Symmetric 5 x 5 power. Skin: No rashes, lesions or ulcers Psychiatry:  Mood & affect appropriate.     Data Reviewed: I have personally reviewed following labs and imaging studies  CBC: Recent Labs  Lab 07/02/20 0757 07/03/20 0224  WBC 13.5* 10.5  HGB 16.1 14.4  HCT 46.5 41.7  MCV 87.2 87.8  PLT 303 267    Basic Metabolic  Panel: Recent Labs  Lab 07/02/20 0757 07/03/20 0224  NA 137 138  K 3.8 3.8  CL 104 107  CO2 22 22  GLUCOSE 125* 110*  BUN 23* 13  CREATININE 1.42* 1.08  CALCIUM 10.1 8.7*  MG  --  2.1  PHOS  --  3.6    GFR: Estimated Creatinine Clearance: 80.9 mL/min (by C-G formula based on SCr of 1.08 mg/dL).  Liver Function Tests: Recent Labs  Lab 07/03/20 0224  ALBUMIN 3.3*    CBG: No results for input(s): GLUCAP in the last 168 hours.   Recent Results (from the past 240 hour(s))  Resp Panel by RT-PCR (Flu A&B, Covid) Nasopharyngeal Swab     Status: None   Collection Time: 07/02/20  9:13 AM   Specimen: Nasopharyngeal Swab; Nasopharyngeal(NP) swabs in vial transport medium  Result Value Ref Range  Status   SARS Coronavirus 2 by RT PCR NEGATIVE NEGATIVE Final    Comment: (NOTE) SARS-CoV-2 target nucleic acids are NOT DETECTED.  The SARS-CoV-2 RNA is generally detectable in upper respiratory specimens during the acute phase of infection. The lowest concentration of SARS-CoV-2 viral copies this assay can detect is 138 copies/mL. A negative result does not preclude SARS-Cov-2 infection and should not be used as the sole basis for treatment or other patient management decisions. A negative result may occur with  improper specimen collection/handling, submission of specimen other than nasopharyngeal swab, presence of viral mutation(s) within the areas targeted by this assay, and inadequate number of viral copies(<138 copies/mL). A negative result must be combined with clinical observations, patient history, and epidemiological information. The expected result is Negative.  Fact Sheet for Patients:  BloggerCourse.com  Fact Sheet for Healthcare Providers:  SeriousBroker.it  This test is no t yet approved or cleared by the [place] FDA and  has been authorized for detection and/or diagnosis of SARS-CoV-2 by FDA under an Emergency Use Authorization (EUA). This EUA will remain  in effect (meaning this test can be used) for the duration of the COVID-19 declaration under Section 564(b)(1) of the Act, 21 U.S.C.section 360bbb-3(b)(1), unless the authorization is terminated  or revoked sooner.       Influenza A by PCR NEGATIVE NEGATIVE Final   Influenza B by PCR NEGATIVE NEGATIVE Final    Comment: (NOTE) The Xpert Xpress SARS-CoV-2/FLU/RSV plus assay is intended as an aid in the diagnosis of influenza from Nasopharyngeal swab specimens and should not be used as a sole basis for treatment. Nasal washings and aspirates are unacceptable for Xpert Xpress SARS-CoV-2/FLU/RSV testing.  Fact Sheet for  Patients: BloggerCourse.com  Fact Sheet for Healthcare Providers: SeriousBroker.it  This test is not yet approved or cleared by the [place] FDA and has been authorized for detection and/or diagnosis of SARS-CoV-2 by FDA under an Emergency Use Authorization (EUA). This EUA will remain in effect (meaning this test can be used) for the duration of the COVID-19 declaration under Section 564(b)(1) of the Act, 21 U.S.C. section 360bbb-3(b)(1), unless the authorization is terminated or revoked.  Performed at [facility], [address], [place], [place] [address]          Radiology Studies: DG Chest [facility] 1 View  Result Date: 07/02/2020 CLINICAL DATA:  Chest pain EXAM: PORTABLE CHEST 1 VIEW COMPARISON:  05/30/2007 FINDINGS: The heart size and mediastinal contours are within normal limits. Both lungs are clear. The visualized skeletal structures are unremarkable. IMPRESSION: No active disease. Electronically Signed   By: Eulah Pont.D.  On: 07/02/2020 08:32   ECHOCARDIOGRAM COMPLETE  Result Date: 07/02/2020    ECHOCARDIOGRAM REPORT   Patient Name:   Bing MatterDONALD Frame Date of Exam: 07/02/2020 Medical Rec #:  [id number]    Height:       68.0 in Accession #:    [id number]   Weight:       175.0 lb Date of Birth:  04/01/1970    BSA:          1.931 m Patient Age:    49 years     BP:           155/100 mmHg Patient Gender: M            HR:           66 bpm. Exam Location:  Inpatient Procedure: 2D Echo STAT ECHO Indications:    Chest pain  History:        Patient has no prior history of Echocardiogram examinations.                 Signs/Symptoms:Chest Pain; Risk Factors:Current Smoker and                 Dyslipidemia.  Sonographer:    Delcie RochLauren Pennington Referring Phys: [id number] HAO MENG IMPRESSIONS  1. Left ventricular ejection fraction, by estimation, is 60 to 65%. The left ventricle has normal function. The left ventricle has no  regional wall motion abnormalities. Left ventricular diastolic parameters were normal.  2. Right ventricular systolic function is normal. The right ventricular size is normal. Tricuspid regurgitation signal is inadequate for assessing PA pressure.  3. The mitral valve is normal in structure. Trivial mitral valve regurgitation. No evidence of mitral stenosis.  4. The aortic valve is normal in structure. Aortic valve regurgitation is not visualized. No aortic stenosis is present.  5. The inferior vena cava is normal in size with greater than 50% respiratory variability, suggesting right atrial pressure of 3 mmHg. FINDINGS  Left Ventricle: Left ventricular ejection fraction, by estimation, is 60 to 65%. The left ventricle has normal function. The left ventricle has no regional wall motion abnormalities. The left ventricular internal cavity size was normal in size. There is  no left ventricular hypertrophy. Left ventricular diastolic parameters were normal. Normal left ventricular filling pressure. Right Ventricle: The right ventricular size is normal. No increase in right ventricular wall thickness. Right ventricular systolic function is normal. Tricuspid regurgitation signal is inadequate for assessing PA pressure. Left Atrium: Left atrial size was normal in size. Right Atrium: Right atrial size was normal in size. Pericardium: There is no evidence of pericardial effusion. Mitral Valve: The mitral valve is normal in structure. Trivial mitral valve regurgitation. No evidence of mitral valve stenosis. Tricuspid Valve: The tricuspid valve is normal in structure. Tricuspid valve regurgitation is not demonstrated. No evidence of tricuspid stenosis. Aortic Valve: The aortic valve is normal in structure. Aortic valve regurgitation is not visualized. No aortic stenosis is present. Pulmonic Valve: The pulmonic valve was normal in structure. Pulmonic valve regurgitation is not visualized. No evidence of pulmonic stenosis.  Aorta: The aortic root is normal in size and structure. Venous: The inferior vena cava was not well visualized. The inferior vena cava is normal in size with greater than 50% respiratory variability, suggesting right atrial pressure of 3 mmHg. IAS/Shunts: No atrial level shunt detected by color flow Doppler.  LEFT VENTRICLE PLAX 2D LVIDd:         4.50 cm  Diastology LVIDs:  2.80 cm  LV e' medial:    8.92 cm/s LV PW:         0.90 cm  LV E/e' medial:  7.9 LV IVS:        0.90 cm  LV e' lateral:   12.40 cm/s LVOT diam:     1.80 cm  LV E/e' lateral: 5.7 LV SV:         62 LV SV Index:   32 LVOT Area:     2.54 cm  RIGHT VENTRICLE             IVC RV S prime:     12.80 cm/s  IVC diam: 1.60 cm TAPSE (M-mode): 2.7 cm LEFT ATRIUM             Index       RIGHT ATRIUM           Index LA diam:        4.00 cm 2.07 cm/m  RA Area:     15.60 cm LA Vol (A2C):   44.0 ml 22.79 ml/m RA Volume:   40.30 ml  20.87 ml/m LA Vol (A4C):   39.6 ml 20.51 ml/m LA Biplane Vol: 42.4 ml 21.96 ml/m  AORTIC VALVE LVOT Vmax:   121.00 cm/s LVOT Vmean:  78.100 cm/s LVOT VTI:    0.244 m  AORTA Ao Root diam: 2.90 cm Ao Asc diam:  3.40 cm MITRAL VALVE MV Area (PHT): 2.80 cm    SHUNTS MV Decel Time: 271 msec    Systemic VTI:  0.24 m MV E velocity: 70.70 cm/s  Systemic Diam: 1.80 cm MV A velocity: 67.10 cm/s MV E/A ratio:  1.05 Armanda Magic MD Electronically signed by Armanda Magic MD Signature Date/Time: 07/02/2020/6:52:51 PM    Final         Scheduled Meds:  aspirin  81 mg Oral Pre-Cath   aspirin EC  81 mg Oral Daily   atorvastatin  80 mg Oral Daily   metoprolol tartrate  25 mg Oral BID   sodium chloride flush  3 mL Intravenous Q12H   sodium chloride flush  3 mL Intravenous Q12H   Continuous Infusions:  sodium chloride     heparin 1,100 Units/hr (07/03/20 0804)   nitroGLYCERIN 5 mcg/min (07/02/20 2337)     LOS: 0 days        Kathlen Mody, MD [facility]   To contact the attending provider between 7A-7P or the  covering provider during after hours 7P-7A, please log into the web site [email or web address] and access using universal [place] password for that web site. If you do not have the password, please call the hospital operator.  07/03/2020, 1:14 PM

## 2020-07-04 LAB — CBC
HCT: 41.8 % (ref 39.0–52.0)
Hemoglobin: 14.1 g/dL (ref 13.0–17.0)
MCH: 30 pg (ref 26.0–34.0)
MCHC: 33.7 g/dL (ref 30.0–36.0)
MCV: 88.9 fL (ref 80.0–100.0)
Platelets: 261 10*3/uL (ref 150–400)
RBC: 4.7 MIL/uL (ref 4.22–5.81)
RDW: 13.1 % (ref 11.5–15.5)
WBC: 9.9 10*3/uL (ref 4.0–10.5)
nRBC: 0 % (ref 0.0–0.2)

## 2020-07-04 LAB — HEPARIN LEVEL (UNFRACTIONATED): Heparin Unfractionated: 0.23 IU/mL — ABNORMAL LOW (ref 0.30–0.70)

## 2020-07-04 MED ORDER — LOSARTAN POTASSIUM 50 MG PO TABS
50.0000 mg | ORAL_TABLET | Freq: Every day | ORAL | Status: DC
  Administered 2020-07-04 – 2020-07-06 (×3): 50 mg via ORAL
  Filled 2020-07-04 (×3): qty 1

## 2020-07-04 NOTE — Progress Notes (Signed)
ANTICOAGULATION CONSULT NOTE Pharmacy Consult for heparin Indication: chest pain/ACS  No Known Allergies  Patient Measurements: Height: 5\' 8"  (172.7 cm) Weight: 79.4 kg (175 lb) IBW/kg (Calculated) : 68.4 Heparin Dosing Weight: 79.4 kg  Vital Signs: Temp: 98.4 F (36.9 C) (07/02 2337) Temp Source: Oral (07/02 2337) BP: 115/79 (07/02 2337) Pulse Rate: 54 (07/02 2337)  Labs: Recent Labs    07/02/20 0757 07/02/20 0954 07/02/20 1150 07/02/20 1648 07/02/20 1650 07/02/20 1740 07/02/20 2000 07/03/20 0224 07/03/20 1446 07/04/20 0253  HGB 16.1  --   --   --   --   --   --  14.4  --  14.1  HCT 46.5  --   --   --   --   --   --  41.7  --  41.8  PLT 303  --   --   --   --   --   --  267  --  261  APTT  --   --  33  --   --   --   --   --   --   --   LABPROT  --   --  12.3  --   --   --   --   --   --   --   INR  --   --  0.9  --   --   --   --   --   --   --   HEPARINUNFRC  --   --   --    < >  --   --   --  0.11* 0.30 0.23*  CREATININE 1.42*  --   --   --   --   --   --  1.08  --   --   TROPONINIHS 59*   < >  --   --  65* 51* 42*  --   --   --    < > = values in this interval not displayed.     Estimated Creatinine Clearance: 80.9 mL/min (by C-G formula based on SCr of 1.08 mg/dL).  Assessment: 50 y.o. male with chest pain for heparin   Goal of Therapy:  Heparin level 0.3-0.7 units/ml Monitor platelets by anticoagulation protocol: Yes   Plan:  Increase Heparin 1350 units/hr  52, PharmD, BCPS

## 2020-07-04 NOTE — Progress Notes (Signed)
Progress Note  Patient Name: Francisco Juarez Date of Encounter: 07/04/2020  [facility] Cardiologist: Dr Mayford Knife  Subjective   Pt denies CP or dyspnea  Inpatient Medications    Scheduled Meds:  aspirin EC  81 mg Oral Daily   atorvastatin  80 mg Oral Daily   metoprolol tartrate  12.5 mg Oral BID   sodium chloride flush  3 mL Intravenous Q12H   sodium chloride flush  3 mL Intravenous Q12H   Continuous Infusions:  sodium chloride     heparin 1,350 Units/hr (07/04/20 0611)   nitroGLYCERIN 15 mcg/min (07/04/20 0612)   PRN Meds: sodium chloride, acetaminophen, labetalol, nitroGLYCERIN, ondansetron (ZOFRAN) IV, sodium chloride flush   Vital Signs    Vitals:   07/03/20 2337 07/04/20 0437 07/04/20 0612 07/04/20 0635  BP: 115/79 (!) 154/90 (!) 147/86 135/82  Pulse: (!) 54 (!) 56 (!) 54   Resp: 18 19 18 18   Temp: 98.4 F (36.9 C) 98.2 F (36.8 C)    TempSrc: Oral Oral    SpO2: 94%     Weight:      Height:        Intake/Output Summary (Last 24 hours) at 07/04/2020 0805 Last data filed at 07/04/2020 0436 Gross per 24 hour  Intake 460.77 ml  Output 1675 ml  Net -1214.23 ml    Last 3 Weights 07/02/2020 07/02/2020 10/14/2014  Weight (lbs) 175 lb 175 lb 176 lb 3.2 oz  Weight (kg) 79.379 kg 79.379 kg 79.924 kg      Telemetry    Sinus with rare PVC- Personally Reviewed   Physical Exam   GEN: No acute distress.  WD WN Neck: No JVD, supple Cardiac: RRR Respiratory: CTA GI: Soft, NT/ND MS: No edema Neuro:  Grossly intact Psych: Normal affect   Labs    High Sensitivity Troponin:   Recent Labs  Lab 07/02/20 0757 07/02/20 0954 07/02/20 1650 07/02/20 1740 07/02/20 2000  TROPONINIHS 59* 115* 65* 51* 42*       Chemistry Recent Labs  Lab 07/02/20 0757 07/03/20 0224  NA 137 138  K 3.8 3.8  CL 104 107  CO2 22 22  GLUCOSE 125* 110*  BUN 23* 13  CREATININE 1.42* 1.08  CALCIUM 10.1 8.7*  ALBUMIN  --  3.3*  GFRNONAA >60 >60  ANIONGAP 11 9       Hematology Recent Labs  Lab 07/02/20 0757 07/03/20 0224 07/04/20 0253  WBC 13.5* 10.5 9.9  RBC 5.33 4.75 4.70  HGB 16.1 14.4 14.1  HCT 46.5 41.7 41.8  MCV 87.2 87.8 88.9  MCH 30.2 30.3 30.0  MCHC 34.6 34.5 33.7  RDW 13.2 13.2 13.1  PLT 303 267 261      Radiology    DG Chest Port 1 View  Result Date: 07/02/2020 CLINICAL DATA:  Chest pain EXAM: PORTABLE CHEST 1 VIEW COMPARISON:  05/30/2007 FINDINGS: The heart size and mediastinal contours are within normal limits. Both lungs are clear. The visualized skeletal structures are unremarkable. IMPRESSION: No active disease. Electronically Signed   By: 06/01/2007 M.D.   On: 07/02/2020 08:32   ECHOCARDIOGRAM COMPLETE  Result Date: 07/02/2020    ECHOCARDIOGRAM REPORT   Patient Name:   Francisco Juarez Date of Exam: 07/02/2020 Medical Rec #:  09/02/2020    Height:       68.0 in Accession #:    [id number]   Weight:       175.0 lb Date of Birth:  [birth year]    BSA:  1.931 m Patient Age:    49 years     BP:           155/100 mmHg Patient Gender: M            HR:           66 bpm. Exam Location:  Inpatient Procedure: 2D Echo STAT ECHO Indications:    Chest pain  History:        Patient has no prior history of Echocardiogram examinations.                 Signs/Symptoms:Chest Pain; Risk Factors:Current Smoker and                 Dyslipidemia.  Sonographer:    Delcie Roch Referring Phys: [id number] HAO MENG IMPRESSIONS  1. Left ventricular ejection fraction, by estimation, is 60 to 65%. The left ventricle has normal function. The left ventricle has no regional wall motion abnormalities. Left ventricular diastolic parameters were normal.  2. Right ventricular systolic function is normal. The right ventricular size is normal. Tricuspid regurgitation signal is inadequate for assessing PA pressure.  3. The mitral valve is normal in structure. Trivial mitral valve regurgitation. No evidence of mitral stenosis.  4. The aortic valve is normal in structure.  Aortic valve regurgitation is not visualized. No aortic stenosis is present.  5. The inferior vena cava is normal in size with greater than 50% respiratory variability, suggesting right atrial pressure of 3 mmHg. FINDINGS  Left Ventricle: Left ventricular ejection fraction, by estimation, is 60 to 65%. The left ventricle has normal function. The left ventricle has no regional wall motion abnormalities. The left ventricular internal cavity size was normal in size. There is  no left ventricular hypertrophy. Left ventricular diastolic parameters were normal. Normal left ventricular filling pressure. Right Ventricle: The right ventricular size is normal. No increase in right ventricular wall thickness. Right ventricular systolic function is normal. Tricuspid regurgitation signal is inadequate for assessing PA pressure. Left Atrium: Left atrial size was normal in size. Right Atrium: Right atrial size was normal in size. Pericardium: There is no evidence of pericardial effusion. Mitral Valve: The mitral valve is normal in structure. Trivial mitral valve regurgitation. No evidence of mitral valve stenosis. Tricuspid Valve: The tricuspid valve is normal in structure. Tricuspid valve regurgitation is not demonstrated. No evidence of tricuspid stenosis. Aortic Valve: The aortic valve is normal in structure. Aortic valve regurgitation is not visualized. No aortic stenosis is present. Pulmonic Valve: The pulmonic valve was normal in structure. Pulmonic valve regurgitation is not visualized. No evidence of pulmonic stenosis. Aorta: The aortic root is normal in size and structure. Venous: The inferior vena cava was not well visualized. The inferior vena cava is normal in size with greater than 50% respiratory variability, suggesting right atrial pressure of 3 mmHg. IAS/Shunts: No atrial level shunt detected by color flow Doppler.  LEFT VENTRICLE PLAX 2D LVIDd:         4.50 cm  Diastology LVIDs:         2.80 cm  LV e' medial:     8.92 cm/s LV PW:         0.90 cm  LV E/e' medial:  7.9 LV IVS:        0.90 cm  LV e' lateral:   12.40 cm/s LVOT diam:     1.80 cm  LV E/e' lateral: 5.7 LV SV:         62 LV SV Index:  32 LVOT Area:     2.54 cm  RIGHT VENTRICLE             IVC RV S prime:     12.80 cm/s  IVC diam: 1.60 cm TAPSE (M-mode): 2.7 cm LEFT ATRIUM             Index       RIGHT ATRIUM           Index LA diam:        4.00 cm 2.07 cm/m  RA Area:     15.60 cm LA Vol (A2C):   44.0 ml 22.79 ml/m RA Volume:   40.30 ml  20.87 ml/m LA Vol (A4C):   39.6 ml 20.51 ml/m LA Biplane Vol: 42.4 ml 21.96 ml/m  AORTIC VALVE LVOT Vmax:   121.00 cm/s LVOT Vmean:  78.100 cm/s LVOT VTI:    0.244 m  AORTA Ao Root diam: 2.90 cm Ao Asc diam:  3.40 cm MITRAL VALVE MV Area (PHT): 2.80 cm    SHUNTS MV Decel Time: 271 msec    Systemic VTI:  0.24 m MV E velocity: 70.70 cm/s  Systemic Diam: 1.80 cm MV A velocity: 67.10 cm/s MV E/A ratio:  1.05 Armanda Magic MD Electronically signed by Armanda Magic MD Signature Date/Time: 07/02/2020/6:52:51 PM    Final      Patient Profile     50 y.o. male with past medical history of hypertension, hyperlipidemia, tobacco abuse admitted with non-ST elevation myocardial infarction.  Echocardiogram shows normal LV function.  Assessment & Plan    1 non-ST elevation myocardial infarction-patient remains pain-free this morning.  Continue present medications including aspirin, heparin, nitroglycerin, metoprolol and statin.  Plan is for cardiac catheterization on Tuesday (or sooner if he develops recurrent symptoms).    2 hyperlipidemia-continue statin.  3 hypertension-blood pressure is trending up.  Add losartan 50 mg daily and follow.  4 tobacco abuse-patient previously counseled on discontinuing.  For questions or updates, please contact [facility] Please consult [email or web address] for contact info under        Signed, Olga Millers, MD  07/04/2020, 8:05 AM  ]

## 2020-07-04 NOTE — Progress Notes (Signed)
PROGRESS NOTE    Francisco Juarez  EUM:[id number] DOB: 05-29-1970 DOA: 07/02/2020 PCP: Pcp, No   Chief Complaint  Patient presents with   Chest Pain    Brief Narrative:   Francisco Juarez is a 50 y.o. male with history of HTN and HLD not on meds, tobacco use disorder, noncompliance and possible OSA presenting with chest pain.  In ED, hypertensive to 198/113 but improved to 155/100 after labetalol and hydralazine push.  100% on room air.  Afebrile. Cr/BUN 1.45/23 (unknown baseline).  Troponin 59>> 115.  EKG normal sinus rhythm with Q waves but no acute ischemic finding abnormal interval.  WBC 13.5.  CXR, COVID-19 and influenza PCR negative.  Received full dose aspirin, IV hydralazine, IV labetalol, and started on heparin infusion.  Transferred to [facility] for chest pain evaluation. Pt admitted for NSTEMI. Cardiology consulted.  He is scheduled for cardiac catheterization next week.    Assessment & Plan:   Active Problems:   Chest pain   NSTEMI (non-ST elevated myocardial infarction) [facility])  NSTEMI:  Chest pain free.  Continue with aspirin, metoprolol, NTG gtt and heparin gtt.  Cardiology consulted, plan for cath next week.  No chest pain or sob.    Hypertension:  Well controlled.    Hyperlipidemia:  Continue with lipitor.    Tobacco abuse;  Counseled on cessation.   AKI:  Probably from dehydration Creatinine improved with hydration.      DVT prophylaxis: (Heparin Code Status: (Full code) Family Communication: none at bedside.  Disposition:   Status is: Inpatient.   The patient will require care spanning > 2 midnights and should be moved to inpatient because: Ongoing diagnostic testing needed not appropriate for outpatient work up  Dispo: The patient is from: Home              Anticipated d/c is to: Home              Patient currently is not medically stable to d/c.   Difficult to place patient No       Consultants:  Cardiology.   Procedures: (none.    Antimicrobials: none.    Subjective: No new complaints.   Objective: Vitals:   07/03/20 2337 07/04/20 0437 07/04/20 0612 07/04/20 0635  BP: 115/79 (!) 154/90 (!) 147/86 135/82  Pulse: (!) 54 (!) 56 (!) 54   Resp: 18 19 18 18   Temp: 98.4 F (36.9 C) 98.2 F (36.8 C)    TempSrc: Oral Oral    SpO2: 94%     Weight:      Height:        Intake/Output Summary (Last 24 hours) at 07/04/2020 1321 Last data filed at 07/04/2020 0900 Gross per 24 hour  Intake 580.77 ml  Output 1375 ml  Net -794.23 ml    Filed Weights   07/02/20 0757 07/02/20 1100  Weight: 79.4 kg 79.4 kg    Examination:  General exam: alert and comfortable.  Respiratory system: Clear to auscultation, no wheezing or rhonchi.  Cardiovascular system: S1 & S2 heard, RRR no JVD, no pedal edema.  Gastrointestinal system: Abdomen is soft,NT ND BS+ Central nervous system: Alert and oriented. Grossly non focal Extremities: no pedal edema. Skin: No rashes seen.  Psychiatry:  Mood & affect appropriate.     Data Reviewed: I have personally reviewed following labs and imaging studies  CBC: Recent Labs  Lab 07/02/20 0757 07/03/20 0224 07/04/20 0253  WBC 13.5* 10.5 9.9  HGB 16.1 14.4 14.1  HCT 46.5 41.7  41.8  MCV 87.2 87.8 88.9  PLT 303 267 261     Basic Metabolic Panel: Recent Labs  Lab 07/02/20 0757 07/03/20 0224  NA 137 138  K 3.8 3.8  CL 104 107  CO2 22 22  GLUCOSE 125* 110*  BUN 23* 13  CREATININE 1.42* 1.08  CALCIUM 10.1 8.7*  MG  --  2.1  PHOS  --  3.6     GFR: Estimated Creatinine Clearance: 80.9 mL/min (by C-G formula based on SCr of 1.08 mg/dL).  Liver Function Tests: Recent Labs  Lab 07/03/20 0224  ALBUMIN 3.3*     CBG: No results for input(s): GLUCAP in the last 168 hours.   Recent Results (from the past 240 hour(s))  Resp Panel by RT-PCR (Flu A&B, Covid) Nasopharyngeal Swab     Status: None   Collection Time: 07/02/20  9:13 AM   Specimen: Nasopharyngeal Swab;  Nasopharyngeal(NP) swabs in vial transport medium  Result Value Ref Range Status   SARS Coronavirus 2 by RT PCR NEGATIVE NEGATIVE Final    Comment: (NOTE) SARS-CoV-2 target nucleic acids are NOT DETECTED.  The SARS-CoV-2 RNA is generally detectable in upper respiratory specimens during the acute phase of infection. The lowest concentration of SARS-CoV-2 viral copies this assay can detect is 138 copies/mL. A negative result does not preclude SARS-Cov-2 infection and should not be used as the sole basis for treatment or other patient management decisions. A negative result may occur with  improper specimen collection/handling, submission of specimen other than nasopharyngeal swab, presence of viral mutation(s) within the areas targeted by this assay, and inadequate number of viral copies(<138 copies/mL). A negative result must be combined with clinical observations, patient history, and epidemiological information. The expected result is Negative.  Fact Sheet for Patients:  BloggerCourse.com  Fact Sheet for Healthcare Providers:  SeriousBroker.it  This test is no t yet approved or cleared by the [place] FDA and  has been authorized for detection and/or diagnosis of SARS-CoV-2 by FDA under an Emergency Use Authorization (EUA). This EUA will remain  in effect (meaning this test can be used) for the duration of the COVID-19 declaration under Section 564(b)(1) of the Act, 21 U.S.C.section 360bbb-3(b)(1), unless the authorization is terminated  or revoked sooner.       Influenza A by PCR NEGATIVE NEGATIVE Final   Influenza B by PCR NEGATIVE NEGATIVE Final    Comment: (NOTE) The Xpert Xpress SARS-CoV-2/FLU/RSV plus assay is intended as an aid in the diagnosis of influenza from Nasopharyngeal swab specimens and should not be used as a sole basis for treatment. Nasal washings and aspirates are unacceptable for Xpert Xpress  SARS-CoV-2/FLU/RSV testing.  Fact Sheet for Patients: BloggerCourse.com  Fact Sheet for Healthcare Providers: SeriousBroker.it  This test is not yet approved or cleared by the [place] FDA and has been authorized for detection and/or diagnosis of SARS-CoV-2 by FDA under an Emergency Use Authorization (EUA). This EUA will remain in effect (meaning this test can be used) for the duration of the COVID-19 declaration under Section 564(b)(1) of the Act, 21 U.S.C. section 360bbb-3(b)(1), unless the authorization is terminated or revoked.  Performed at [facility], [address] [place], [place] [address]           Radiology Studies: ECHOCARDIOGRAM COMPLETE  Result Date: 07/02/2020    ECHOCARDIOGRAM REPORT   Patient Name:   Francisco Juarez Date of Exam: 07/02/2020 Medical Rec #:  [id number]    Height:  68.0 in Accession #:    [id number]   Weight:       175.0 lb Date of Birth:  1970-03-10    BSA:          1.931 m Patient Age:    49 years     BP:           155/100 mmHg Patient Gender: M            HR:           66 bpm. Exam Location:  Inpatient Procedure: 2D Echo STAT ECHO Indications:    Chest pain  History:        Patient has no prior history of Echocardiogram examinations.                 Signs/Symptoms:Chest Pain; Risk Factors:Current Smoker and                 Dyslipidemia.  Sonographer:    Delcie Roch Referring Phys: [id number] HAO MENG IMPRESSIONS  1. Left ventricular ejection fraction, by estimation, is 60 to 65%. The left ventricle has normal function. The left ventricle has no regional wall motion abnormalities. Left ventricular diastolic parameters were normal.  2. Right ventricular systolic function is normal. The right ventricular size is normal. Tricuspid regurgitation signal is inadequate for assessing PA pressure.  3. The mitral valve is normal in structure. Trivial mitral valve regurgitation. No  evidence of mitral stenosis.  4. The aortic valve is normal in structure. Aortic valve regurgitation is not visualized. No aortic stenosis is present.  5. The inferior vena cava is normal in size with greater than 50% respiratory variability, suggesting right atrial pressure of 3 mmHg. FINDINGS  Left Ventricle: Left ventricular ejection fraction, by estimation, is 60 to 65%. The left ventricle has normal function. The left ventricle has no regional wall motion abnormalities. The left ventricular internal cavity size was normal in size. There is  no left ventricular hypertrophy. Left ventricular diastolic parameters were normal. Normal left ventricular filling pressure. Right Ventricle: The right ventricular size is normal. No increase in right ventricular wall thickness. Right ventricular systolic function is normal. Tricuspid regurgitation signal is inadequate for assessing PA pressure. Left Atrium: Left atrial size was normal in size. Right Atrium: Right atrial size was normal in size. Pericardium: There is no evidence of pericardial effusion. Mitral Valve: The mitral valve is normal in structure. Trivial mitral valve regurgitation. No evidence of mitral valve stenosis. Tricuspid Valve: The tricuspid valve is normal in structure. Tricuspid valve regurgitation is not demonstrated. No evidence of tricuspid stenosis. Aortic Valve: The aortic valve is normal in structure. Aortic valve regurgitation is not visualized. No aortic stenosis is present. Pulmonic Valve: The pulmonic valve was normal in structure. Pulmonic valve regurgitation is not visualized. No evidence of pulmonic stenosis. Aorta: The aortic root is normal in size and structure. Venous: The inferior vena cava was not well visualized. The inferior vena cava is normal in size with greater than 50% respiratory variability, suggesting right atrial pressure of 3 mmHg. IAS/Shunts: No atrial level shunt detected by color flow Doppler.  LEFT VENTRICLE PLAX 2D  LVIDd:         4.50 cm  Diastology LVIDs:         2.80 cm  LV e' medial:    8.92 cm/s LV PW:         0.90 cm  LV E/e' medial:  7.9 LV IVS:  0.90 cm  LV e' lateral:   12.40 cm/s LVOT diam:     1.80 cm  LV E/e' lateral: 5.7 LV SV:         62 LV SV Index:   32 LVOT Area:     2.54 cm  RIGHT VENTRICLE             IVC RV S prime:     12.80 cm/s  IVC diam: 1.60 cm TAPSE (M-mode): 2.7 cm LEFT ATRIUM             Index       RIGHT ATRIUM           Index LA diam:        4.00 cm 2.07 cm/m  RA Area:     15.60 cm LA Vol (A2C):   44.0 ml 22.79 ml/m RA Volume:   40.30 ml  20.87 ml/m LA Vol (A4C):   39.6 ml 20.51 ml/m LA Biplane Vol: 42.4 ml 21.96 ml/m  AORTIC VALVE LVOT Vmax:   121.00 cm/s LVOT Vmean:  78.100 cm/s LVOT VTI:    0.244 m  AORTA Ao Root diam: 2.90 cm Ao Asc diam:  3.40 cm MITRAL VALVE MV Area (PHT): 2.80 cm    SHUNTS MV Decel Time: 271 msec    Systemic VTI:  0.24 m MV E velocity: 70.70 cm/s  Systemic Diam: 1.80 cm MV A velocity: 67.10 cm/s MV E/A ratio:  1.05 Armanda Magicraci Turner MD Electronically signed by Armanda Magicraci Turner MD Signature Date/Time: 07/02/2020/6:52:51 PM    Final         Scheduled Meds:  aspirin EC  81 mg Oral Daily   atorvastatin  80 mg Oral Daily   losartan  50 mg Oral Daily   metoprolol tartrate  12.5 mg Oral BID   sodium chloride flush  3 mL Intravenous Q12H   sodium chloride flush  3 mL Intravenous Q12H   Continuous Infusions:  sodium chloride     heparin 1,350 Units/hr (07/04/20 0611)   nitroGLYCERIN 20 mcg/min (07/04/20 0816)     LOS: 1 day        Kathlen ModyVijaya Faithann Natal, MD [facility]   To contact the attending provider between 7A-7P or the covering provider during after hours 7P-7A, please log into the web site [email or web address] and access using universal [place] password for that web site. If you do not have the password, please call the hospital operator.  07/04/2020, 1:21 PM

## 2020-07-05 LAB — BASIC METABOLIC PANEL
Anion gap: 6 (ref 5–15)
BUN: 11 mg/dL (ref 6–20)
CO2: 22 mmol/L (ref 22–32)
Calcium: 8.9 mg/dL (ref 8.9–10.3)
Chloride: 111 mmol/L (ref 98–111)
Creatinine, Ser: 0.99 mg/dL (ref 0.61–1.24)
GFR, Estimated: >60 mL/min (ref >60)
Glucose, Bld: 100 mg/dL — ABNORMAL HIGH (ref 70–99)
Potassium: 4.3 mmol/L (ref 3.5–5.1)
Sodium: 139 mmol/L (ref 135–145)

## 2020-07-05 LAB — CBC
HCT: 40.1 % (ref 39.0–52.0)
Hemoglobin: 13.8 g/dL (ref 13.0–17.0)
MCH: 30.3 pg (ref 26.0–34.0)
MCHC: 34.4 g/dL (ref 30.0–36.0)
MCV: 88.1 fL (ref 80.0–100.0)
Platelets: 235 10*3/uL (ref 150–400)
RBC: 4.55 MIL/uL (ref 4.22–5.81)
RDW: 12.8 % (ref 11.5–15.5)
WBC: 12.9 10*3/uL — ABNORMAL HIGH (ref 4.0–10.5)
nRBC: 0 % (ref 0.0–0.2)

## 2020-07-05 LAB — HEPARIN LEVEL (UNFRACTIONATED)
Heparin Unfractionated: <0.1 IU/mL — ABNORMAL LOW (ref 0.30–0.70)
Heparin Unfractionated: 0.33 IU/mL (ref 0.30–0.70)

## 2020-07-05 MED ORDER — SODIUM CHLORIDE 0.9% FLUSH: 3.0000 mL | INTRAVENOUS | Status: DC | PRN

## 2020-07-05 MED ORDER — SODIUM CHLORIDE 0.9 % WEIGHT BASED INFUSION
1.0000 mL/kg/h | INTRAVENOUS | Status: DC
  Administered 2020-07-06: 1 mL/kg/h via INTRAVENOUS

## 2020-07-05 MED ORDER — SODIUM CHLORIDE 0.9 % WEIGHT BASED INFUSION
3.0000 mL/kg/h | INTRAVENOUS | Status: DC
  Administered 2020-07-06: 3 mL/kg/h via INTRAVENOUS

## 2020-07-05 MED ORDER — ASPIRIN 81 MG PO CHEW
81.0000 mg | CHEWABLE_TABLET | ORAL | Status: AC
  Administered 2020-07-06: 81 mg via ORAL
  Filled 2020-07-05: qty 1

## 2020-07-05 MED ORDER — SODIUM CHLORIDE 0.9 % IV SOLN: 250.0000 mL | INTRAVENOUS | Status: DC | PRN

## 2020-07-05 NOTE — Progress Notes (Signed)
PROGRESS NOTE    Francisco Juarez  VQQ:[id number] DOB: 1970-11-25 DOA: 07/02/2020 PCP: Pcp, No   Chief Complaint  Patient presents with   Chest Pain    Brief Narrative:   Francisco Juarez is a 50 y.o. male with history of HTN and HLD not on meds, tobacco use disorder, noncompliance and possible OSA presenting with chest pain.  In ED, hypertensive to 198/113 but improved to 155/100 after labetalol and hydralazine push.  100% on room air.  Afebrile. Cr/BUN 1.45/23 (unknown baseline).  Troponin 59>> 115.  EKG normal sinus rhythm with Q waves but no acute ischemic finding abnormal interval.  WBC 13.5.  CXR, COVID-19 and influenza PCR negative.  Received full dose aspirin, IV hydralazine, IV labetalol, and started on heparin infusion.  Transferred to [facility] for chest pain evaluation. Pt admitted for NSTEMI. Cardiology consulted.  He is scheduled for cardiac catheterization next week.  Pt seen and examined at bedside, no new complaints today.    Assessment & Plan:   Active Problems:   Chest pain   NSTEMI (non-ST elevated myocardial infarction) [facility])  NSTEMI:  Chest pain free.  Continue with aspirin, metoprolol, NTG gtt and heparin gtt.  Cardiology consulted, plan for cath next week.  No chest pain or sob.    Hypertension:  Slightly elevated BP parameters. Prn labetalol in place, continue with losartan, bb.    Hyperlipidemia:  Continue with lipitor.    Tobacco abuse;  Counseled on cessation.   AKI:  Probably from dehydration Creatinine improved with hydration. Recheck labs today.   Leukocytosis:  Probably reactive, . Denies dysuria and no sob. Recheck CBC in am    DVT prophylaxis: (Heparin Code Status: (Full code) Family Communication: none at bedside.  Disposition:   Status is: Inpatient.   The patient will require care spanning > 2 midnights and should be moved to inpatient because: Ongoing diagnostic testing needed not appropriate for outpatient work up  Dispo: The patient  is from: Home              Anticipated d/c is to: Home              Patient currently is not medically stable to d/c.   Difficult to place patient No       Consultants:  Cardiology.   Procedures: (none.   Antimicrobials: none.    Subjective: No chest pain or sob.   Objective: Vitals:   07/05/20 0407 07/05/20 0506 07/05/20 0606 07/05/20 0831  BP: 132/86 (!) 146/92 (!) 142/111 (!) 152/93  Pulse: (!) 47 62 (!) 56 (!) 58  Resp: 13 20 18    Temp:      TempSrc:      SpO2: 97% 98% 97%   Weight:      Height:        Intake/Output Summary (Last 24 hours) at 07/05/2020 1045 Last data filed at 07/05/2020 1041 Gross per 24 hour  Intake 763.59 ml  Output 2375 ml  Net -1611.41 ml    Filed Weights   07/02/20 0757 07/02/20 1100  Weight: 79.4 kg 79.4 kg    Examination:  General exam: alert and oriented, not in distress.  Respiratory system: Clear to auscultation, no wheezing or rhonchi.  Cardiovascular system: S1 & S2 heard, RRR, no JVD, no pedal edema.  Gastrointestinal system: Abdomen is soft,non tender non distended, bowel sounds wnl.  Central nervous system: Alert and oriented, non focal Extremities: no pedal edema.  Skin: No rashes seen.  Psychiatry:  Mood is  appropriate.     Data Reviewed: I have personally reviewed following labs and imaging studies  CBC: Recent Labs  Lab 07/02/20 0757 07/03/20 0224 07/04/20 0253 07/05/20 0151  WBC 13.5* 10.5 9.9 12.9*  HGB 16.1 14.4 14.1 13.8  HCT 46.5 41.7 41.8 40.1  MCV 87.2 87.8 88.9 88.1  PLT 303 267 261 235     Basic Metabolic Panel: Recent Labs  Lab 07/02/20 0757 07/03/20 0224  NA 137 138  K 3.8 3.8  CL 104 107  CO2 22 22  GLUCOSE 125* 110*  BUN 23* 13  CREATININE 1.42* 1.08  CALCIUM 10.1 8.7*  MG  --  2.1  PHOS  --  3.6     GFR: Estimated Creatinine Clearance: 80.9 mL/min (by C-G formula based on SCr of 1.08 mg/dL).  Liver Function Tests: Recent Labs  Lab 07/03/20 0224  ALBUMIN 3.3*      CBG: No results for input(s): GLUCAP in the last 168 hours.   Recent Results (from the past 240 hour(s))  Resp Panel by RT-PCR (Flu A&B, Covid) Nasopharyngeal Swab     Status: None   Collection Time: 07/02/20  9:13 AM   Specimen: Nasopharyngeal Swab; Nasopharyngeal(NP) swabs in vial transport medium  Result Value Ref Range Status   SARS Coronavirus 2 by RT PCR NEGATIVE NEGATIVE Final    Comment: (NOTE) SARS-CoV-2 target nucleic acids are NOT DETECTED.  The SARS-CoV-2 RNA is generally detectable in upper respiratory specimens during the acute phase of infection. The lowest concentration of SARS-CoV-2 viral copies this assay can detect is 138 copies/mL. A negative result does not preclude SARS-Cov-2 infection and should not be used as the sole basis for treatment or other patient management decisions. A negative result may occur with  improper specimen collection/handling, submission of specimen other than nasopharyngeal swab, presence of viral mutation(s) within the areas targeted by this assay, and inadequate number of viral copies(<138 copies/mL). A negative result must be combined with clinical observations, patient history, and epidemiological information. The expected result is Negative.  Fact Sheet for Patients:  BloggerCourse.com  Fact Sheet for Healthcare Providers:  SeriousBroker.it  This test is no t yet approved or cleared by the [place] FDA and  has been authorized for detection and/or diagnosis of SARS-CoV-2 by FDA under an Emergency Use Authorization (EUA). This EUA will remain  in effect (meaning this test can be used) for the duration of the COVID-19 declaration under Section 564(b)(1) of the Act, 21 U.S.C.section 360bbb-3(b)(1), unless the authorization is terminated  or revoked sooner.       Influenza A by PCR NEGATIVE NEGATIVE Final   Influenza B by PCR NEGATIVE NEGATIVE Final    Comment:  (NOTE) The Xpert Xpress SARS-CoV-2/FLU/RSV plus assay is intended as an aid in the diagnosis of influenza from Nasopharyngeal swab specimens and should not be used as a sole basis for treatment. Nasal washings and aspirates are unacceptable for Xpert Xpress SARS-CoV-2/FLU/RSV testing.  Fact Sheet for Patients: BloggerCourse.com  Fact Sheet for Healthcare Providers: SeriousBroker.it  This test is not yet approved or cleared by the [place] FDA and has been authorized for detection and/or diagnosis of SARS-CoV-2 by FDA under an Emergency Use Authorization (EUA). This EUA will remain in effect (meaning this test can be used) for the duration of the COVID-19 declaration under Section 564(b)(1) of the Act, 21 U.S.C. section 360bbb-3(b)(1), unless the authorization is terminated or revoked.  Performed at [facility], [address] [place], [place] [address]  Radiology Studies: No results found.      Scheduled Meds:  aspirin EC  81 mg Oral Daily   atorvastatin  80 mg Oral Daily   losartan  50 mg Oral Daily   metoprolol tartrate  12.5 mg Oral BID   sodium chloride flush  3 mL Intravenous Q12H   sodium chloride flush  3 mL Intravenous Q12H   Continuous Infusions:  sodium chloride     heparin 1,400 Units/hr (07/05/20 1041)   nitroGLYCERIN 10 mcg/min (07/05/20 1041)     LOS: 2 days        Kathlen Mody, MD [facility]   To contact the attending provider between 7A-7P or the covering provider during after hours 7P-7A, please log into the web site [email or web address] and access using universal [place] password for that web site. If you do not have the password, please call the hospital operator.  07/05/2020, 10:45 AM

## 2020-07-05 NOTE — Progress Notes (Signed)
Progress Note  Patient Name: Francisco Juarez Date of Encounter: 07/05/2020  [facility] Cardiologist: Dr Mayford Knife  Subjective   No CP or dyspnea  Inpatient Medications    Scheduled Meds:  aspirin EC  81 mg Oral Daily   atorvastatin  80 mg Oral Daily   losartan  50 mg Oral Daily   metoprolol tartrate  12.5 mg Oral BID   sodium chloride flush  3 mL Intravenous Q12H   sodium chloride flush  3 mL Intravenous Q12H   Continuous Infusions:  sodium chloride     heparin 1,350 Units/hr (07/05/20 0051)   nitroGLYCERIN 10 mcg/min (07/05/20 0704)   PRN Meds: sodium chloride, acetaminophen, labetalol, nitroGLYCERIN, ondansetron (ZOFRAN) IV, sodium chloride flush   Vital Signs    Vitals:   07/05/20 0306 07/05/20 0407 07/05/20 0506 07/05/20 0606  BP: (!) 141/90 132/86 (!) 146/92 (!) 142/111  Pulse: (!) 48 (!) 47 62 (!) 56  Resp: 14 13 20 18   Temp:      TempSrc:      SpO2: 98% 97% 98% 97%  Weight:      Height:        Intake/Output Summary (Last 24 hours) at 07/05/2020 0731 Last data filed at 07/05/2020 0704 Gross per 24 hour  Intake 744.64 ml  Output 2375 ml  Net -1630.36 ml    Last 3 Weights 07/02/2020 07/02/2020 10/14/2014  Weight (lbs) 175 lb 175 lb 176 lb 3.2 oz  Weight (kg) 79.379 kg 79.379 kg 79.924 kg      Telemetry    Sinus bradycardia; Personally Reviewed   Physical Exam   GEN: WD WN NAD Neck: Supple Cardiac: RRR, no murmur Respiratory: CTA; no wheeze GI: Soft, NT/ND, no masses MS: No edema Neuro:  No focal findings Psych: Normal affect   Labs    High Sensitivity Troponin:   Recent Labs  Lab 07/02/20 0757 07/02/20 0954 07/02/20 1650 07/02/20 1740 07/02/20 2000  TROPONINIHS 59* 115* 65* 51* 42*       Chemistry Recent Labs  Lab 07/02/20 0757 07/03/20 0224  NA 137 138  K 3.8 3.8  CL 104 107  CO2 22 22  GLUCOSE 125* 110*  BUN 23* 13  CREATININE 1.42* 1.08  CALCIUM 10.1 8.7*  ALBUMIN  --  3.3*  GFRNONAA >60 >60  ANIONGAP 11 9       Hematology Recent Labs  Lab 07/03/20 0224 07/04/20 0253 07/05/20 0151  WBC 10.5 9.9 12.9*  RBC 4.75 4.70 4.55  HGB 14.4 14.1 13.8  HCT 41.7 41.8 40.1  MCV 87.8 88.9 88.1  MCH 30.3 30.0 30.3  MCHC 34.5 33.7 34.4  RDW 13.2 13.1 12.8  PLT 267 261 235      Patient Profile     50 y.o. male with past medical history of hypertension, hyperlipidemia, tobacco abuse admitted with non-ST elevation myocardial infarction.  Echocardiogram shows normal LV function.  Assessment & Plan    1 non-ST elevation myocardial infarction-patient doing well this morning with no recurrent chest pain.  Continue aspirin, heparin, nitroglycerin, metoprolol and statin.  Proceed with cardiac catheterization tomorrow as outlined.    2 hyperlipidemia-continue statin.  3 hypertension-blood pressure was trending up yesterday.  Losartan added.  We will follow and increase or add medications as needed.  4 tobacco abuse-patient previously counseled on discontinuing.  5 snoring-we will arrange sleep study as an outpatient.  For questions or updates, please contact [facility] Please consult [email or web address] for contact info under  Signed, Olga Millers, MD  07/05/2020, 7:31 AM  ]

## 2020-07-05 NOTE — Progress Notes (Signed)
ANTICOAGULATION CONSULT NOTE - Initial Consult  Pharmacy Consult for heparin Indication: chest pain/ACS  No Known Allergies  Patient Measurements: Height: 5\' 8"  (172.7 cm) Weight: 79.4 kg (175 lb) IBW/kg (Calculated) : 68.4 Heparin Dosing Weight: 79.4 kg  Vital Signs: BP: 152/93 (07/04 0831) Pulse Rate: 58 (07/04 0831)  Labs: Recent Labs    07/02/20 1150 07/02/20 1648 07/02/20 1650 07/02/20 1740 07/02/20 2000 07/03/20 0224 07/03/20 1446 07/04/20 0253 07/05/20 0151 07/05/20 0617  HGB  --    < >  --   --   --  14.4  --  14.1 13.8  --   HCT  --   --   --   --   --  41.7  --  41.8 40.1  --   PLT  --   --   --   --   --  267  --  261 235  --   APTT 33  --   --   --   --   --   --   --   --   --   LABPROT 12.3  --   --   --   --   --   --   --   --   --   INR 0.9  --   --   --   --   --   --   --   --   --   HEPARINUNFRC  --    < >  --   --   --  0.11*   < > 0.23* <0.10* 0.33  CREATININE  --   --   --   --   --  1.08  --   --   --   --   TROPONINIHS  --   --  65* 51* 42*  --   --   --   --   --    < > = values in this interval not displayed.     Estimated Creatinine Clearance: 80.9 mL/min (by C-G formula based on SCr of 1.08 mg/dL).   Medical History: Past Medical History:  Diagnosis Date   Chronic pain in left shoulder    Hyperlipidemia    Hypertension    Leukocytosis 11/20/2013   referred to heamtology in 2015   Polyarthralgia    referred to rheum in 20a5   Assessment: 50 yo M with CP that is radiating down his left arm. Has HLD and HTN as baseline. Hx of smoking. No anticoagulation prior to admission. Pharmacy consulted for heparin.    HL therapeutic at 0.33 at low end. CBC stable.   Goal of Therapy:  Heparin level 0.3-0.7 units/ml Monitor platelets by anticoagulation protocol: Yes   Plan:    Increase heparin to 1400 units/hr Monitor daily HL, CBC/plt Monitor for signs/symptoms of bleeding  F/u Cath   53, PharmD, BCPS, [facility] Clinical  Pharmacist  Please check AMION for all [facility] Pharmacy phone numbers After 10:00 PM, call Main Pharmacy [phone]

## 2020-07-05 NOTE — Plan of Care (Signed)

## 2020-07-06 ENCOUNTER — Encounter (HOSPITAL_COMMUNITY): Payer: Self-pay | Admitting: Interventional Cardiology

## 2020-07-06 ENCOUNTER — Encounter (HOSPITAL_COMMUNITY)
Admission: EM | Disposition: A | Discharge status: Home / Self Care | Payer: Self-pay | Source: Home / Self Care | Attending: Internal Medicine

## 2020-07-06 DIAGNOSIS — F17200 Nicotine dependence, unspecified, uncomplicated: Secondary | ICD-10-CM

## 2020-07-06 DIAGNOSIS — Z720 Tobacco use: Secondary | ICD-10-CM

## 2020-07-06 DIAGNOSIS — I160 Hypertensive urgency: Secondary | ICD-10-CM

## 2020-07-06 DIAGNOSIS — I2510 Atherosclerotic heart disease of native coronary artery without angina pectoris: Secondary | ICD-10-CM

## 2020-07-06 HISTORY — PX: INTRAVASCULAR PRESSURE WIRE/FFR STUDY: CATH118243

## 2020-07-06 HISTORY — PX: LEFT HEART CATH AND CORONARY ANGIOGRAPHY: CATH118249

## 2020-07-06 LAB — CBC
HCT: 42.2 % (ref 39.0–52.0)
Hemoglobin: 14.4 g/dL (ref 13.0–17.0)
MCH: 30 pg (ref 26.0–34.0)
MCHC: 34.1 g/dL (ref 30.0–36.0)
MCV: 87.9 fL (ref 80.0–100.0)
Platelets: 259 10*3/uL (ref 150–400)
RBC: 4.8 MIL/uL (ref 4.22–5.81)
RDW: 12.4 % (ref 11.5–15.5)
WBC: 12.4 10*3/uL — ABNORMAL HIGH (ref 4.0–10.5)
nRBC: 0 % (ref 0.0–0.2)

## 2020-07-06 LAB — CREATININE, SERUM
Creatinine, Ser: 1.04 mg/dL (ref 0.61–1.24)
GFR, Estimated: >60 mL/min (ref >60)

## 2020-07-06 LAB — POCT ACTIVATED CLOTTING TIME: Activated Clotting Time: 237 seconds

## 2020-07-06 LAB — HEPARIN LEVEL (UNFRACTIONATED): Heparin Unfractionated: 0.29 IU/mL — ABNORMAL LOW (ref 0.30–0.70)

## 2020-07-06 SURGERY — LEFT HEART CATH AND CORONARY ANGIOGRAPHY
Anesthesia: LOCAL

## 2020-07-06 MED ORDER — CLOPIDOGREL BISULFATE 75 MG PO TABS
300.0000 mg | ORAL_TABLET | Freq: Once | ORAL | Status: AC
  Administered 2020-07-06: 300 mg via ORAL
  Filled 2020-07-06: qty 4

## 2020-07-06 MED ORDER — LIDOCAINE HCL (PF) 1 % IJ SOLN
INTRAMUSCULAR | Status: DC | PRN
  Administered 2020-07-06: 2 mL

## 2020-07-06 MED ORDER — HEPARIN (PORCINE) IN NACL 1000-0.9 UT/500ML-% IV SOLN
INTRAVENOUS | Status: DC | PRN
  Administered 2020-07-06 (×2): 500 mL

## 2020-07-06 MED ORDER — MIDAZOLAM HCL 2 MG/2ML IJ SOLN
INTRAMUSCULAR | Status: DC | PRN
  Administered 2020-07-06: 1 mg via INTRAVENOUS

## 2020-07-06 MED ORDER — CLOPIDOGREL BISULFATE 75 MG PO TABS: 75.0000 mg | ORAL_TABLET | Freq: Every day | ORAL | Status: DC

## 2020-07-06 MED ORDER — ASPIRIN 81 MG PO TBEC: 81.0000 mg | DELAYED_RELEASE_TABLET | Freq: Every day | ORAL | 11 refills | Status: DC

## 2020-07-06 MED ORDER — CLOPIDOGREL BISULFATE 75 MG PO TABS: 300.0000 mg | ORAL_TABLET | Freq: Once | ORAL | Status: DC

## 2020-07-06 MED ORDER — SODIUM CHLORIDE 0.9 % IV SOLN: 250.0000 mL | INTRAVENOUS | Status: DC | PRN

## 2020-07-06 MED ORDER — IOHEXOL 350 MG/ML SOLN
INTRAVENOUS | Status: DC | PRN
  Administered 2020-07-06: 100 mL

## 2020-07-06 MED ORDER — ACETAMINOPHEN 325 MG PO TABS: 650.0000 mg | ORAL_TABLET | ORAL | Status: DC | PRN

## 2020-07-06 MED ORDER — NITROGLYCERIN 0.4 MG SL SUBL: 0.4000 mg | SUBLINGUAL_TABLET | SUBLINGUAL | 12 refills | Status: DC | PRN

## 2020-07-06 MED ORDER — HEPARIN SODIUM (PORCINE) 5000 UNIT/ML IJ SOLN: 5000.0000 [IU] | Freq: Three times a day (TID) | INTRAMUSCULAR | Status: DC

## 2020-07-06 MED ORDER — METOPROLOL TARTRATE 25 MG PO TABS: 12.5000 mg | ORAL_TABLET | Freq: Two times a day (BID) | ORAL | 2 refills | Status: DC

## 2020-07-06 MED ORDER — MIDAZOLAM HCL 2 MG/2ML IJ SOLN
INTRAMUSCULAR | Status: AC
  Filled 2020-07-06: qty 2

## 2020-07-06 MED ORDER — LOSARTAN POTASSIUM 50 MG PO TABS: 100.0000 mg | ORAL_TABLET | Freq: Every day | ORAL | 2 refills | Status: DC

## 2020-07-06 MED ORDER — LOSARTAN POTASSIUM 50 MG PO TABS: 50.0000 mg | ORAL_TABLET | Freq: Every day | ORAL | 11 refills | Status: DC

## 2020-07-06 MED ORDER — NITROGLYCERIN IN D5W 200-5 MCG/ML-% IV SOLN: 3.0000 ug/min | INTRAVENOUS | Status: DC

## 2020-07-06 MED ORDER — ONDANSETRON HCL 4 MG/2ML IJ SOLN: 4.0000 mg | Freq: Four times a day (QID) | INTRAMUSCULAR | Status: DC | PRN

## 2020-07-06 MED ORDER — FENTANYL CITRATE (PF) 100 MCG/2ML IJ SOLN
INTRAMUSCULAR | Status: DC | PRN
  Administered 2020-07-06: 25 ug via INTRAVENOUS

## 2020-07-06 MED ORDER — CLOPIDOGREL BISULFATE 75 MG PO TABS: 75.0000 mg | ORAL_TABLET | Freq: Every day | ORAL | 2 refills | Status: DC

## 2020-07-06 MED ORDER — VERAPAMIL HCL 2.5 MG/ML IV SOLN
INTRAVENOUS | Status: AC
  Filled 2020-07-06: qty 2

## 2020-07-06 MED ORDER — HEPARIN SODIUM (PORCINE) 1000 UNIT/ML IJ SOLN
INTRAMUSCULAR | Status: DC | PRN
  Administered 2020-07-06: 4000 [IU] via INTRAVENOUS
  Administered 2020-07-06: 2000 [IU] via INTRAVENOUS
  Administered 2020-07-06: 4000 [IU] via INTRAVENOUS

## 2020-07-06 MED ORDER — SODIUM CHLORIDE 0.9% FLUSH: 3.0000 mL | Freq: Two times a day (BID) | INTRAVENOUS | Status: DC

## 2020-07-06 MED ORDER — CLOPIDOGREL BISULFATE 75 MG PO TABS: 300.0000 mg | ORAL_TABLET | Freq: Every day | ORAL | Status: DC

## 2020-07-06 MED ORDER — METOPROLOL SUCCINATE ER 25 MG PO TB24: 25.0000 mg | ORAL_TABLET | Freq: Every day | ORAL | 11 refills | Status: DC

## 2020-07-06 MED ORDER — ASPIRIN 81 MG PO CHEW: 81.0000 mg | CHEWABLE_TABLET | Freq: Every day | ORAL | Status: DC

## 2020-07-06 MED ORDER — VERAPAMIL HCL 2.5 MG/ML IV SOLN
INTRAVENOUS | Status: DC | PRN
  Administered 2020-07-06: 10 mL via INTRA_ARTERIAL

## 2020-07-06 MED ORDER — HEPARIN (PORCINE) IN NACL 1000-0.9 UT/500ML-% IV SOLN
INTRAVENOUS | Status: AC
  Filled 2020-07-06: qty 1000

## 2020-07-06 MED ORDER — LIDOCAINE HCL (PF) 1 % IJ SOLN
INTRAMUSCULAR | Status: AC
  Filled 2020-07-06: qty 30

## 2020-07-06 MED ORDER — OXYCODONE HCL 5 MG PO TABS: 5.0000 mg | ORAL_TABLET | ORAL | Status: DC | PRN

## 2020-07-06 MED ORDER — SODIUM CHLORIDE 0.9 % IV SOLN: INTRAVENOUS | Status: AC

## 2020-07-06 MED ORDER — HYDRALAZINE HCL 20 MG/ML IJ SOLN: 10.0000 mg | INTRAMUSCULAR | Status: AC | PRN

## 2020-07-06 MED ORDER — LABETALOL HCL 5 MG/ML IV SOLN: 10.0000 mg | INTRAVENOUS | Status: AC | PRN

## 2020-07-06 MED ORDER — SODIUM CHLORIDE 0.9% FLUSH: 3.0000 mL | INTRAVENOUS | Status: DC | PRN

## 2020-07-06 MED ORDER — FENTANYL CITRATE (PF) 100 MCG/2ML IJ SOLN
INTRAMUSCULAR | Status: AC
  Filled 2020-07-06: qty 2

## 2020-07-06 MED ORDER — ATORVASTATIN CALCIUM 80 MG PO TABS: 80.0000 mg | ORAL_TABLET | Freq: Every day | ORAL | 2 refills | Status: DC

## 2020-07-06 SURGICAL SUPPLY — 13 items
CATH 5FR JL3.5 JR4 ANG PIG MP (CATHETERS) ×1 IMPLANT
CATH LAUNCHER 6FR EBU3.5 (CATHETERS) ×2 IMPLANT
DEVICE RAD COMP TR BAND LRG (VASCULAR PRODUCTS) ×1 IMPLANT
GLIDESHEATH SLEND A-KIT 6F 22G (SHEATH) ×1 IMPLANT
GUIDEWIRE PRESSURE X 175 (WIRE) ×1 IMPLANT
KIT ESSENTIALS PG (KITS) ×1 IMPLANT
KIT HEART LEFT (KITS) ×2 IMPLANT
PACK CARDIAC CATHETERIZATION (CUSTOM PROCEDURE TRAY) ×2 IMPLANT
SHEATH PROBE COVER 6X72 (BAG) ×1 IMPLANT
TRANSDUCER W/STOPCOCK (MISCELLANEOUS) ×2 IMPLANT
TUBING CIL FLEX 10 FLL-RA (TUBING) ×2 IMPLANT
WIRE EMERALD 3MM-J .035X260CM (WIRE) ×1 IMPLANT
WIRE HI TORQ VERSACORE J 260CM (WIRE) ×1 IMPLANT

## 2020-07-06 NOTE — Progress Notes (Signed)
LHC  today showed:  Diffuse 60 to 80% stenosis in the mid to distal nondominant RCA, second obtuse marginal, 50% proximal to mid ramus, 30 to 70% mid LAD, and 90% ostial diagonal #1 (relatively large vessel and possibly the culprit). 70% mid LAD RFR 0.93 (abnormal less than 0.89) First diagonal was not treated as it could potentially involve the LAD.  The best approach given this patient's young age is aggressive medical management of angina. Normal LV systolic function with EF estimated to be 50 to 55%.  LVEDP 19 mmHg, consistent with diastolic heart failure likely related to Poorly controlled blood pressure.   RECOMMENDATIONS:   Aggressive antianginal therapy.  Once optimized, if angina remains a limiting symptom, consideration of diagonal PCI would then be appropriate. Aggressive preventive therapy: Blood pressure control; smoking cessation; LDL target less than 55. Started Plavix in addition to aspirin which should be continued for at least 6 months. Wean and DC nitroglycerin today.   Discussed with Dr Jens Som, patient can be discharged on following regimen: ASA 81mg  + Plavix 75mg  for 6 month, losartan 50mg , Metoprolol 25mg  XL, and Lipitor 80mg . Informed hospitalist provider.   Will arrange outpatient follow up with cardiology, message sent to scheduler

## 2020-07-06 NOTE — Progress Notes (Signed)
Progress Note  Patient Name: Francisco Juarez Date of Encounter: 07/06/2020  [facility] Cardiologist: Dr Mayford Knife  Subjective   Pt denies CP or dyspnea  Inpatient Medications    Scheduled Meds:  aspirin EC  81 mg Oral Daily   atorvastatin  80 mg Oral Daily   losartan  50 mg Oral Daily   metoprolol tartrate  12.5 mg Oral BID   sodium chloride flush  3 mL Intravenous Q12H   sodium chloride flush  3 mL Intravenous Q12H   Continuous Infusions:  sodium chloride     sodium chloride     sodium chloride 1 mL/kg/hr (07/06/20 0605)   heparin 1,400 Units/hr (07/05/20 1958)   nitroGLYCERIN 10 mcg/min (07/06/20 0615)   PRN Meds: sodium chloride, sodium chloride, acetaminophen, labetalol, nitroGLYCERIN, ondansetron (ZOFRAN) IV, sodium chloride flush, sodium chloride flush   Vital Signs    Vitals:   07/05/20 2153 07/05/20 2253 07/05/20 2353 07/06/20 0053  BP: (!) 149/92 128/80 128/85 121/67  Pulse:      Resp: 18 14 17    Temp:      TempSrc:      SpO2:      Weight:      Height:        Intake/Output Summary (Last 24 hours) at 07/06/2020 09/06/2020 Last data filed at 07/06/2020 0610 Gross per 24 hour  Intake 387.2 ml  Output 1875 ml  Net -1487.8 ml    Last 3 Weights 07/02/2020 07/02/2020 10/14/2014  Weight (lbs) 175 lb 175 lb 176 lb 3.2 oz  Weight (kg) 79.379 kg 79.379 kg 79.924 kg      Telemetry    Sinus bradycardia; Personally Reviewed   Physical Exam   GEN: NAD Neck: Supple, no JVD Cardiac: RRR, no gallops Respiratory: CTA; no rhonchi GI: Soft, NT/ND MS: No edema Neuro: Grossly intact Psych: Normal affect   Labs    High Sensitivity Troponin:   Recent Labs  Lab 07/02/20 0757 07/02/20 0954 07/02/20 1650 07/02/20 1740 07/02/20 2000  TROPONINIHS 59* 115* 65* 51* 42*       Chemistry Recent Labs  Lab 07/02/20 0757 07/03/20 0224 07/05/20 1112  NA 137 138 139  K 3.8 3.8 4.3  CL 104 107 111  CO2 22 22 22   GLUCOSE 125* 110* 100*  BUN 23* 13 11  CREATININE  1.42* 1.08 0.99  CALCIUM 10.1 8.7* 8.9  ALBUMIN  --  3.3*  --   GFRNONAA >60 >60 >60  ANIONGAP 11 9 6       Hematology Recent Labs  Lab 07/04/20 0253 07/05/20 0151 07/06/20 0218  WBC 9.9 12.9* 12.4*  RBC 4.70 4.55 4.80  HGB 14.1 13.8 14.4  HCT 41.8 40.1 42.2  MCV 88.9 88.1 87.9  MCH 30.0 30.3 30.0  MCHC 33.7 34.4 34.1  RDW 13.1 12.8 12.4  PLT 261 235 259      Patient Profile     50 y.o. male with past medical history of hypertension, hyperlipidemia, tobacco abuse admitted with non-ST elevation myocardial infarction.  Echocardiogram shows normal LV function.  Assessment & Plan    1 non-ST elevation myocardial infarction-patient remains pain-free.  Continue present medications including aspirin, heparin, nitroglycerin, metoprolol and statin.  Cardiac catheterization is scheduled for today.    2 hyperlipidemia-continue statin.  3 hypertension-blood pressure borderline.  After discontinuing nitroglycerin we will likely need to increase losartan.  4 tobacco abuse-patient previously counseled on discontinuing.  5 snoring-we will arrange sleep study as an outpatient.  For questions or  updates, please contact [facility] Please consult [email or web address] for contact info under        Signed, Olga Millers, MD  07/06/2020, 7:12 AM  ]

## 2020-07-06 NOTE — Progress Notes (Addendum)
TR BAND REMOVAL  LOCATION:    right radial  DEFLATED PER PROTOCOL:    Yes.    TIME BAND OFF / DRESSING APPLIED:    1102   SITE UPON ARRIVAL:    Level 0  SITE AFTER BAND REMOVAL:    Level 0  CIRCULATION SENSATION AND MOVEMENT:    Within Normal Limits   Yes.    COMMENTS: dressing applied, good capillary refill

## 2020-07-06 NOTE — Progress Notes (Signed)
   07/06/20 1030  Clinical Encounter Type  Visited With Patient and family together  Visit Type Initial  Referral From Nurse;Physician  Consult/Referral To Chaplain   Chaplain responded to consult for Advance Directive. Chaplain provided AD education to Pt and two family members present. Pt wants to name his wife as his health care agent. Chaplain answered Pt's and Pt's family's questions. Pt knows to reach out when ready for notarization and given chaplain office number for any further questions. Chaplain remains available.   This note was prepared by Chaplain Resident, Tacy Learn, MDiv. Chaplain remains available as needed through the on-call pager: [phone].

## 2020-07-06 NOTE — Discharge Summary (Signed)
Physician Discharge Summary  Francisco Juarez GDJ:[id number] DOB: Apr 27, 1970 DOA: 07/02/2020  PCP: Pcp, No  Admit date: 07/02/2020 Discharge date: 07/06/2020  Admitted From: Home.  Disposition:  Home.   Recommendations for Outpatient Follow-up:  Follow up with PCP in 1-2 weeks Please obtain BMP/CBC in one week Please follow up with cardiology in 1 to 2 weeks.     Discharge Condition:stable.  CODE STATUS:Full code.  Diet recommendation: Heart Healthy  Brief/Interim Summary:  Francisco Juarez is a 50 y.o. male with history of HTN and HLD not on meds, tobacco use disorder, noncompliance and possible OSA presenting with chest pain. In ED, hypertensive to 198/113 but improved to 155/100 after labetalol and hydralazine push.  100% on room air.  Afebrile. Cr/BUN 1.45/23 (unknown baseline).  Troponin 59>> 115.  EKG normal sinus rhythm with Q waves but no acute ischemic finding abnormal interval.  WBC 13.5.  CXR, COVID-19 and influenza PCR negative.  Received full dose aspirin, IV hydralazine, IV labetalol, and started on heparin infusion.  Transferred to [facility] for chest pain evaluation. Pt admitted for NSTEMI. Cardiology consulted. He udnerwent cardiac cath showing Diffuse 60 to 80% stenosis in the mid to distal nondominant RCA, second obtuse marginal, 50% proximal to mid ramus, 30 to 70% mid LAD, and 90% ostial diagonal #1 (relatively large vessel and possibly the culprit). 70% mid LAD RFR 0.93 (abnormal less than 0.89) First diagonal was not treated as it could potentially involve the LAD.  The best approach given this patient's young age is aggressive medical management of angina. Normal LV systolic function with EF estimated to be 50 to 55%.  LVEDP 19 mmHg, consistent with diastolic heart failure likely related to Poorly controlled blood pressure. He was discharged on anti anginal and aspirin and plavix and recommended to follow up with cardiology as recommended.    Discharge Diagnoses:  Principal  Problem:   NSTEMI (non-ST elevated myocardial infarction) [facility]) Active Problems:   Chest pain   Hypertensive urgency   Tobacco abuse  NSTEMI: Chest pain free. Continue with aspirin, metoprolol, NTG gtt and heparin gtt. Cardiology consulted, underwent cath and recommended medical management.  No chest pain or sob.      Hypertension:  Slightly elevated BP parameters. Prn labetalol in place, continue with losartan, bb.      Hyperlipidemia: Continue with lipitor.     Tobacco abuse; Counseled on cessation.   AKI: Probably from dehydration Creatinine improved with hydration. Recheck labs today.   Leukocytosis: Probably reactive, . Denies dysuria and no sob. Recheck CBC in am     Discharge Instructions  Discharge Instructions     Ambulatory referral to Cardiology   Complete by: As directed    Diet - low sodium heart healthy   Complete by: As directed    Discharge instructions   Complete by: As directed    Please follow up with cardiology in 2 weeks.   Increase activity slowly   Complete by: As directed       Allergies as of 07/06/2020   No Known Allergies      Medication List     STOP taking these medications    ibuprofen 200 MG tablet Commonly known as: ADVIL       TAKE these medications    aspirin 81 MG EC tablet Take 1 tablet (81 mg total) by mouth daily. Swallow whole. Start taking on: July 07, 2020   atorvastatin 80 MG tablet Commonly known as: LIPITOR Take 1 tablet (80 mg total) by  mouth daily. Start taking on: July 07, 2020   clopidogrel 75 MG tablet Commonly known as: PLAVIX Take 1 tablet (75 mg total) by mouth daily. Start taking on: July 07, 2020   losartan 50 MG tablet Commonly known as: COZAAR Take 2 tablets (100 mg total) by mouth daily. Start taking on: July 07, 2020   metoprolol tartrate 25 MG tablet Commonly known as: LOPRESSOR Take 0.5 tablets (12.5 mg total) by mouth 2 (two) times daily.   nitroGLYCERIN 0.4 MG SL  tablet Commonly known as: NITROSTAT Place 1 tablet (0.4 mg total) under the tongue every 5 (five) minutes as needed for chest pain.        No Known Allergies  Consultations: Cardiology.    Procedures/Studies: CARDIAC CATHETERIZATION  Result Date: 07/06/2020  Diffuse 60 to 80% stenosis in the mid to distal nondominant RCA, second obtuse marginal, 50% proximal to mid ramus, 30 to 70% mid LAD, and 90% ostial diagonal #1 (relatively large vessel and possibly the culprit).  70% mid LAD RFR 0.93 (abnormal less than 0.89)  First diagonal was not treated as it could potentially involve the LAD.  The best approach given this patient's young age is aggressive medical management of angina.  Normal LV systolic function with EF estimated to be 50 to 55%.  LVEDP 19 mmHg, consistent with diastolic heart failure likely related to Poorly controlled blood pressure. RECOMMENDATIONS:  Aggressive antianginal therapy.  Once optimized, if angina remains a limiting symptom, consideration of diagonal PCI would then be appropriate.  Aggressive preventive therapy: Blood pressure control; smoking cessation; LDL target less than 55.  Started Plavix in addition to aspirin which should be continued for at least 6 months.  Wean and DC nitroglycerin today.  DG Chest Port 1 View  Result Date: 07/02/2020 CLINICAL DATA:  Chest pain EXAM: PORTABLE CHEST 1 VIEW COMPARISON:  05/30/2007 FINDINGS: The heart size and mediastinal contours are within normal limits. Both lungs are clear. The visualized skeletal structures are unremarkable. IMPRESSION: No active disease. Electronically Signed   By: Alcide Clever M.D.   On: 07/02/2020 08:32   ECHOCARDIOGRAM COMPLETE  Result Date: 07/02/2020    ECHOCARDIOGRAM REPORT   Patient Name:   Francisco Juarez Date of Exam: 07/02/2020 Medical Rec #:  [id number]    Height:       68.0 in Accession #:    [id number]   Weight:       175.0 lb Date of Birth:  02-14-1970    BSA:          1.931 m Patient Age:     49 years     BP:           155/100 mmHg Patient Gender: M            HR:           66 bpm. Exam Location:  Inpatient Procedure: 2D Echo STAT ECHO Indications:    Chest pain  History:        Patient has no prior history of Echocardiogram examinations.                 Signs/Symptoms:Chest Pain; Risk Factors:Current Smoker and                 Dyslipidemia.  Sonographer:    Delcie Roch Referring Phys: [id number] HAO MENG IMPRESSIONS  1. Left ventricular ejection fraction, by estimation, is 60 to 65%. The left ventricle has normal function. The left ventricle has no regional wall motion  abnormalities. Left ventricular diastolic parameters were normal.  2. Right ventricular systolic function is normal. The right ventricular size is normal. Tricuspid regurgitation signal is inadequate for assessing PA pressure.  3. The mitral valve is normal in structure. Trivial mitral valve regurgitation. No evidence of mitral stenosis.  4. The aortic valve is normal in structure. Aortic valve regurgitation is not visualized. No aortic stenosis is present.  5. The inferior vena cava is normal in size with greater than 50% respiratory variability, suggesting right atrial pressure of 3 mmHg. FINDINGS  Left Ventricle: Left ventricular ejection fraction, by estimation, is 60 to 65%. The left ventricle has normal function. The left ventricle has no regional wall motion abnormalities. The left ventricular internal cavity size was normal in size. There is  no left ventricular hypertrophy. Left ventricular diastolic parameters were normal. Normal left ventricular filling pressure. Right Ventricle: The right ventricular size is normal. No increase in right ventricular wall thickness. Right ventricular systolic function is normal. Tricuspid regurgitation signal is inadequate for assessing PA pressure. Left Atrium: Left atrial size was normal in size. Right Atrium: Right atrial size was normal in size. Pericardium: There is no evidence of  pericardial effusion. Mitral Valve: The mitral valve is normal in structure. Trivial mitral valve regurgitation. No evidence of mitral valve stenosis. Tricuspid Valve: The tricuspid valve is normal in structure. Tricuspid valve regurgitation is not demonstrated. No evidence of tricuspid stenosis. Aortic Valve: The aortic valve is normal in structure. Aortic valve regurgitation is not visualized. No aortic stenosis is present. Pulmonic Valve: The pulmonic valve was normal in structure. Pulmonic valve regurgitation is not visualized. No evidence of pulmonic stenosis. Aorta: The aortic root is normal in size and structure. Venous: The inferior vena cava was not well visualized. The inferior vena cava is normal in size with greater than 50% respiratory variability, suggesting right atrial pressure of 3 mmHg. IAS/Shunts: No atrial level shunt detected by color flow Doppler.  LEFT VENTRICLE PLAX 2D LVIDd:         4.50 cm  Diastology LVIDs:         2.80 cm  LV e' medial:    8.92 cm/s LV PW:         0.90 cm  LV E/e' medial:  7.9 LV IVS:        0.90 cm  LV e' lateral:   12.40 cm/s LVOT diam:     1.80 cm  LV E/e' lateral: 5.7 LV SV:         62 LV SV Index:   32 LVOT Area:     2.54 cm  RIGHT VENTRICLE             IVC RV S prime:     12.80 cm/s  IVC diam: 1.60 cm TAPSE (M-mode): 2.7 cm LEFT ATRIUM             Index       RIGHT ATRIUM           Index LA diam:        4.00 cm 2.07 cm/m  RA Area:     15.60 cm LA Vol (A2C):   44.0 ml 22.79 ml/m RA Volume:   40.30 ml  20.87 ml/m LA Vol (A4C):   39.6 ml 20.51 ml/m LA Biplane Vol: 42.4 ml 21.96 ml/m  AORTIC VALVE LVOT Vmax:   121.00 cm/s LVOT Vmean:  78.100 cm/s LVOT VTI:    0.244 m  AORTA Ao Root diam: 2.90 cm Ao  Asc diam:  3.40 cm MITRAL VALVE MV Area (PHT): 2.80 cm    SHUNTS MV Decel Time: 271 msec    Systemic VTI:  0.24 m MV E velocity: 70.70 cm/s  Systemic Diam: 1.80 cm MV A velocity: 67.10 cm/s MV E/A ratio:  1.05 Armanda Magic MD Electronically signed by Armanda Magic MD  Signature Date/Time: 07/02/2020/6:52:51 PM    Final       Subjective: No chest pain or sob.   Discharge Exam: Vitals:   07/06/20 1122 07/06/20 1411  BP: (!) 164/97 133/84  Pulse: (!) 55   Resp:    Temp:    SpO2: 98%    Vitals:   07/06/20 1037 07/06/20 1052 07/06/20 1122 07/06/20 1411  BP: (!) 151/96 (!) 162/91 (!) 164/97 133/84  Pulse: (!) 59 (!) 57 (!) 55   Resp:      Temp:      TempSrc:      SpO2: 97% 97% 98%   Weight:      Height:        General: Pt is alert, awake, not in acute distress Cardiovascular: RRR, S1/S2 +, no rubs, no gallops Respiratory: CTA bilaterally, no wheezing, no rhonchi Abdominal: Soft, NT, ND, bowel sounds + Extremities: no edema, no cyanosis    The results of significant diagnostics from this hospitalization (including imaging, microbiology, ancillary and laboratory) are listed below for reference.     Microbiology: Recent Results (from the past 240 hour(s))  Resp Panel by RT-PCR (Flu A&B, Covid) Nasopharyngeal Swab     Status: None   Collection Time: 07/02/20  9:13 AM   Specimen: Nasopharyngeal Swab; Nasopharyngeal(NP) swabs in vial transport medium  Result Value Ref Range Status   SARS Coronavirus 2 by RT PCR NEGATIVE NEGATIVE Final    Comment: (NOTE) SARS-CoV-2 target nucleic acids are NOT DETECTED.  The SARS-CoV-2 RNA is generally detectable in upper respiratory specimens during the acute phase of infection. The lowest concentration of SARS-CoV-2 viral copies this assay can detect is 138 copies/mL. A negative result does not preclude SARS-Cov-2 infection and should not be used as the sole basis for treatment or other patient management decisions. A negative result may occur with  improper specimen collection/handling, submission of specimen other than nasopharyngeal swab, presence of viral mutation(s) within the areas targeted by this assay, and inadequate number of viral copies(<138 copies/mL). A negative result must be combined  with clinical observations, patient history, and epidemiological information. The expected result is Negative.  Fact Sheet for Patients:  BloggerCourse.com  Fact Sheet for Healthcare Providers:  SeriousBroker.it  This test is no t yet approved or cleared by the [place] FDA and  has been authorized for detection and/or diagnosis of SARS-CoV-2 by FDA under an Emergency Use Authorization (EUA). This EUA will remain  in effect (meaning this test can be used) for the duration of the COVID-19 declaration under Section 564(b)(1) of the Act, 21 U.S.C.section 360bbb-3(b)(1), unless the authorization is terminated  or revoked sooner.       Influenza A by PCR NEGATIVE NEGATIVE Final   Influenza B by PCR NEGATIVE NEGATIVE Final    Comment: (NOTE) The Xpert Xpress SARS-CoV-2/FLU/RSV plus assay is intended as an aid in the diagnosis of influenza from Nasopharyngeal swab specimens and should not be used as a sole basis for treatment. Nasal washings and aspirates are unacceptable for Xpert Xpress SARS-CoV-2/FLU/RSV testing.  Fact Sheet for Patients: BloggerCourse.com  Fact Sheet for Healthcare Providers: SeriousBroker.it  This test is not  yet approved or cleared by the [place] and has been authorized for detection and/or diagnosis of SARS-CoV-2 by FDA under an Emergency Use Authorization (EUA). This EUA will remain in effect (meaning this test can be used) for the duration of the COVID-19 declaration under Section 564(b)(1) of the Act, 21 U.S.C. section 360bbb-3(b)(1), unless the authorization is terminated or revoked.  Performed at [facility], [address] [place], [place] [address]      Labs: BNP (last 3 results) No results for input(s): BNP in the last 8760 hours. Basic Metabolic Panel: Recent Labs  Lab 07/02/20 0757 07/03/20 0224  07/05/20 1112 07/06/20 0218  NA 137 138 139  --   K 3.8 3.8 4.3  --   CL 104 107 111  --   CO2 22 22 22   --   GLUCOSE 125* 110* 100*  --   BUN 23* 13 11  --   CREATININE 1.42* 1.08 0.99 1.04  CALCIUM 10.1 8.7* 8.9  --   MG  --  2.1  --   --   PHOS  --  3.6  --   --    Liver Function Tests: Recent Labs  Lab 07/03/20 0224  ALBUMIN 3.3*   No results for input(s): LIPASE, AMYLASE in the last 168 hours. No results for input(s): AMMONIA in the last 168 hours. CBC: Recent Labs  Lab 07/02/20 0757 07/03/20 0224 07/04/20 0253 07/05/20 0151 07/06/20 0218  WBC 13.5* 10.5 9.9 12.9* 12.4*  HGB 16.1 14.4 14.1 13.8 14.4  HCT 46.5 41.7 41.8 40.1 42.2  MCV 87.2 87.8 88.9 88.1 87.9  PLT 303 267 261 235 259   Cardiac Enzymes: No results for input(s): CKTOTAL, CKMB, CKMBINDEX, TROPONINI in the last 168 hours. BNP: Invalid input(s): POCBNP CBG: No results for input(s): GLUCAP in the last 168 hours. D-Dimer No results for input(s): DDIMER in the last 72 hours. Hgb A1c No results for input(s): HGBA1C in the last 72 hours. Lipid Profile No results for input(s): CHOL, HDL, LDLCALC, TRIG, CHOLHDL, LDLDIRECT in the last 72 hours. Thyroid function studies No results for input(s): TSH, T4TOTAL, T3FREE, THYROIDAB in the last 72 hours.  Invalid input(s): FREET3 Anemia work up No results for input(s): VITAMINB12, FOLATE, FERRITIN, TIBC, IRON, RETICCTPCT in the last 72 hours. Urinalysis    Component Value Date/Time   COLORURINE YELLOW 05/18/2009 1135   APPEARANCEUR CLEAR 05/18/2009 1135   LABSPEC 1.020 05/18/2009 1135   PHURINE 5.0 05/18/2009 1135   GLUCOSEU NEGATIVE 05/18/2009 1135   HGBUR NEGATIVE 05/18/2009 1135   BILIRUBINUR n 12/02/2010 1142   KETONESUR NEGATIVE 05/18/2009 1135   PROTEINUR n 12/02/2010 1142   PROTEINUR NEGATIVE 05/18/2009 1135   UROBILINOGEN 0.2 12/02/2010 1142   UROBILINOGEN 0.2 05/18/2009 1135   NITRITE n 12/02/2010 1142   NITRITE NEGATIVE 05/18/2009 1135    LEUKOCYTESUR  12/02/2010 1142     Comment:     n   Sepsis Labs Invalid input(s): PROCALCITONIN,  WBC,  LACTICIDVEN Microbiology Recent Results (from the past 240 hour(s))  Resp Panel by RT-PCR (Flu A&B, Covid) Nasopharyngeal Swab     Status: None   Collection Time: 07/02/20  9:13 AM   Specimen: Nasopharyngeal Swab; Nasopharyngeal(NP) swabs in vial transport medium  Result Value Ref Range Status   SARS Coronavirus 2 by RT PCR NEGATIVE NEGATIVE Final    Comment: (NOTE) SARS-CoV-2 target nucleic acids are NOT DETECTED.  The SARS-CoV-2 RNA is generally detectable in upper respiratory specimens during the acute  phase of infection. The lowest concentration of SARS-CoV-2 viral copies this assay can detect is 138 copies/mL. A negative result does not preclude SARS-Cov-2 infection and should not be used as the sole basis for treatment or other patient management decisions. A negative result may occur with  improper specimen collection/handling, submission of specimen other than nasopharyngeal swab, presence of viral mutation(s) within the areas targeted by this assay, and inadequate number of viral copies(<138 copies/mL). A negative result must be combined with clinical observations, patient history, and epidemiological information. The expected result is Negative.  Fact Sheet for Patients:  BloggerCourse.comhttps://www.fda.gov/media/152166/download  Fact Sheet for Healthcare Providers:  SeriousBroker.ithttps://www.fda.gov/media/152162/download  This test is no t yet approved or cleared by the [place] FDA and  has been authorized for detection and/or diagnosis of SARS-CoV-2 by FDA under an Emergency Use Authorization (EUA). This EUA will remain  in effect (meaning this test can be used) for the duration of the COVID-19 declaration under Section 564(b)(1) of the Act, 21 U.S.C.section 360bbb-3(b)(1), unless the authorization is terminated  or revoked sooner.       Influenza A by PCR NEGATIVE NEGATIVE Final    Influenza B by PCR NEGATIVE NEGATIVE Final    Comment: (NOTE) The Xpert Xpress SARS-CoV-2/FLU/RSV plus assay is intended as an aid in the diagnosis of influenza from Nasopharyngeal swab specimens and should not be used as a sole basis for treatment. Nasal washings and aspirates are unacceptable for Xpert Xpress SARS-CoV-2/FLU/RSV testing.  Fact Sheet for Patients: BloggerCourse.comhttps://www.fda.gov/media/152166/download  Fact Sheet for Healthcare Providers: SeriousBroker.ithttps://www.fda.gov/media/152162/download  This test is not yet approved or cleared by the [place] FDA and has been authorized for detection and/or diagnosis of SARS-CoV-2 by FDA under an Emergency Use Authorization (EUA). This EUA will remain in effect (meaning this test can be used) for the duration of the COVID-19 declaration under Section 564(b)(1) of the Act, 21 U.S.C. section 360bbb-3(b)(1), unless the authorization is terminated or revoked.  Performed at [facility], [address], [place], [place] [address]      Time coordinating discharge: 36 minutes.   SIGNED:   Kathlen ModyVijaya Fatime Biswell, MD  [facility] 07/06/2020, 3:43 PM

## 2020-07-06 NOTE — Plan of Care (Signed)

## 2020-07-06 NOTE — CV Procedure (Signed)
Diffuse disease involving the nondominant right coronary 60 to 80%; diffuse disease involving obtuse marginal #1 distal segment 60 to 80%; 80 to 90% ostial diagonal (moderate in size), likely culprit and 60 to 80% mid LAD with RFR 0.93 (abnormal less than 0.89). Normal LV function Recommend aggressive anti-ischemic therapy, blood pressure control, smoking cessation, and lipid management.  It appears that the ostial diagonal could be the culprit although difficult to tell.  The ostial location puts LAD at risk if PCI is performed.  If symptoms resolve with medical therapy, it would be less risky to continue medical therapy then pursue ostial diagonal PCI.

## 2020-07-06 NOTE — Interval H&P Note (Signed)
Cath Lab Visit (complete for each Cath Lab visit)  Clinical Evaluation Leading to the Procedure:   ACS: Yes.    Non-ACS:    Anginal Classification: CCS III  Anti-ischemic medical therapy: Minimal Therapy (1 class of medications)  Non-Invasive Test Results: No non-invasive testing performed  Prior CABG: No previous CABG      History and Physical Interval Note:  07/06/2020 7:25 AM  Bing Matter  has presented today for surgery, with the diagnosis of unstable angina.  The various methods of treatment have been discussed with the patient and family. After consideration of risks, benefits and other options for treatment, the patient has consented to  Procedure(s): LEFT HEART CATH AND CORONARY ANGIOGRAPHY (N/A) as a surgical intervention.  The patient's history has been reviewed, patient examined, no change in status, stable for surgery.  I have reviewed the patient's chart and labs.  Questions were answered to the patient's satisfaction.     Lyn Records III

## 2020-07-06 NOTE — H&P (View-Only) (Signed)
Progress Note  Patient Name: Francisco Juarez Date of Encounter: 07/06/2020  [facility] Cardiologist: Dr Mayford Knife  Subjective   Pt denies CP or dyspnea  Inpatient Medications    Scheduled Meds:  aspirin EC  81 mg Oral Daily   atorvastatin  80 mg Oral Daily   losartan  50 mg Oral Daily   metoprolol tartrate  12.5 mg Oral BID   sodium chloride flush  3 mL Intravenous Q12H   sodium chloride flush  3 mL Intravenous Q12H   Continuous Infusions:  sodium chloride     sodium chloride     sodium chloride 1 mL/kg/hr (07/06/20 0605)   heparin 1,400 Units/hr (07/05/20 1958)   nitroGLYCERIN 10 mcg/min (07/06/20 0615)   PRN Meds: sodium chloride, sodium chloride, acetaminophen, labetalol, nitroGLYCERIN, ondansetron (ZOFRAN) IV, sodium chloride flush, sodium chloride flush   Vital Signs    Vitals:   07/05/20 2153 07/05/20 2253 07/05/20 2353 07/06/20 0053  BP: (!) 149/92 128/80 128/85 121/67  Pulse:      Resp: 18 14 17    Temp:      TempSrc:      SpO2:      Weight:      Height:        Intake/Output Summary (Last 24 hours) at 07/06/2020 09/06/2020 Last data filed at 07/06/2020 0610 Gross per 24 hour  Intake 387.2 ml  Output 1875 ml  Net -1487.8 ml    Last 3 Weights 07/02/2020 07/02/2020 10/14/2014  Weight (lbs) 175 lb 175 lb 176 lb 3.2 oz  Weight (kg) 79.379 kg 79.379 kg 79.924 kg      Telemetry    Sinus bradycardia; Personally Reviewed   Physical Exam   GEN: NAD Neck: Supple, no JVD Cardiac: RRR, no gallops Respiratory: CTA; no rhonchi GI: Soft, NT/ND MS: No edema Neuro: Grossly intact Psych: Normal affect   Labs    High Sensitivity Troponin:   Recent Labs  Lab 07/02/20 0757 07/02/20 0954 07/02/20 1650 07/02/20 1740 07/02/20 2000  TROPONINIHS 59* 115* 65* 51* 42*       Chemistry Recent Labs  Lab 07/02/20 0757 07/03/20 0224 07/05/20 1112  NA 137 138 139  K 3.8 3.8 4.3  CL 104 107 111  CO2 22 22 22   GLUCOSE 125* 110* 100*  BUN 23* 13 11  CREATININE  1.42* 1.08 0.99  CALCIUM 10.1 8.7* 8.9  ALBUMIN  --  3.3*  --   GFRNONAA >60 >60 >60  ANIONGAP 11 9 6       Hematology Recent Labs  Lab 07/04/20 0253 07/05/20 0151 07/06/20 0218  WBC 9.9 12.9* 12.4*  RBC 4.70 4.55 4.80  HGB 14.1 13.8 14.4  HCT 41.8 40.1 42.2  MCV 88.9 88.1 87.9  MCH 30.0 30.3 30.0  MCHC 33.7 34.4 34.1  RDW 13.1 12.8 12.4  PLT 261 235 259      Patient Profile     50 y.o. male with past medical history of hypertension, hyperlipidemia, tobacco abuse admitted with non-ST elevation myocardial infarction.  Echocardiogram shows normal LV function.  Assessment & Plan    1 non-ST elevation myocardial infarction-patient remains pain-free.  Continue present medications including aspirin, heparin, nitroglycerin, metoprolol and statin.  Cardiac catheterization is scheduled for today.    2 hyperlipidemia-continue statin.  3 hypertension-blood pressure borderline.  After discontinuing nitroglycerin we will likely need to increase losartan.  4 tobacco abuse-patient previously counseled on discontinuing.  5 snoring-we will arrange sleep study as an outpatient.  For questions or  updates, please contact [facility] Please consult [email or web address] for contact info under        Signed, Kimyetta Flott, MD  07/06/2020, 7:12 AM  ]  

## 2020-07-16 ENCOUNTER — Telehealth: Payer: Self-pay | Admitting: Family

## 2020-07-16 NOTE — Telephone Encounter (Signed)
Called patient back. Advised that I could send him a heart healthy eating plan with information via his mychart. Patient does have access for this, and the message was sent.   Patient verbalized understanding.

## 2020-07-16 NOTE — Telephone Encounter (Signed)
Pt is calling to get information on what healthy foods he can eat

## 2020-07-20 ENCOUNTER — Encounter (HOSPITAL_BASED_OUTPATIENT_CLINIC_OR_DEPARTMENT_OTHER): Payer: Self-pay

## 2020-07-27 ENCOUNTER — Encounter (HOSPITAL_BASED_OUTPATIENT_CLINIC_OR_DEPARTMENT_OTHER): Payer: Self-pay | Admitting: Family

## 2020-07-27 ENCOUNTER — Other Ambulatory Visit: Payer: Self-pay

## 2020-07-27 ENCOUNTER — Other Ambulatory Visit (HOSPITAL_BASED_OUTPATIENT_CLINIC_OR_DEPARTMENT_OTHER): Payer: Self-pay | Admitting: *Deleted

## 2020-07-27 ENCOUNTER — Ambulatory Visit (INDEPENDENT_AMBULATORY_CARE_PROVIDER_SITE_OTHER): Payer: 59 | Admitting: Family

## 2020-07-27 VITALS — BP 128/78 | HR 67 | Ht 67.0 in | Wt 172.4 lb

## 2020-07-27 DIAGNOSIS — G4730 Sleep apnea, unspecified: Secondary | ICD-10-CM | POA: Diagnosis not present

## 2020-07-27 DIAGNOSIS — R0683 Snoring: Secondary | ICD-10-CM

## 2020-07-27 DIAGNOSIS — I25118 Atherosclerotic heart disease of native coronary artery with other forms of angina pectoris: Secondary | ICD-10-CM | POA: Diagnosis not present

## 2020-07-27 DIAGNOSIS — E785 Hyperlipidemia, unspecified: Secondary | ICD-10-CM | POA: Diagnosis not present

## 2020-07-27 DIAGNOSIS — I10 Essential (primary) hypertension: Secondary | ICD-10-CM

## 2020-07-27 DIAGNOSIS — Z006 Encounter for examination for normal comparison and control in clinical research program: Secondary | ICD-10-CM

## 2020-07-27 MED ORDER — ATORVASTATIN CALCIUM 80 MG PO TABS: 80.0000 mg | ORAL_TABLET | Freq: Every day | ORAL | 11 refills | Status: DC

## 2020-07-27 MED ORDER — METOPROLOL TARTRATE 25 MG PO TABS: 12.5000 mg | ORAL_TABLET | Freq: Two times a day (BID) | ORAL | 11 refills | Status: DC

## 2020-07-27 MED ORDER — ISOSORBIDE MONONITRATE ER 30 MG PO TB24: 30.0000 mg | ORAL_TABLET | Freq: Every day | ORAL | 11 refills | Status: DC

## 2020-07-27 MED ORDER — CLOPIDOGREL BISULFATE 75 MG PO TABS: 75.0000 mg | ORAL_TABLET | Freq: Every day | ORAL | 11 refills | Status: DC

## 2020-07-27 MED ORDER — LOSARTAN POTASSIUM 50 MG PO TABS: 50.0000 mg | ORAL_TABLET | Freq: Every day | ORAL | 11 refills | Status: DC

## 2020-07-27 NOTE — Research (Signed)
Subject # [id number] Amgen Lp(a) [id number] Site # [phone]  SEX _0    Male                      _1    Male  Ethnicity _2   Hispanic or Latino   _3   Not Hispanic or Latino  Race _4   White                 _5   Black or African American  _6   Asian _7   American [place] or [place] Native            _8   Native Hawaiian or Other Pacific Islander                      _9   Other  Other   Age 50  Subject Group _10    Local Lab           _11   Historical Lp(a) value                                       Results:  Future research _12    Yes                    _13   No    Amgen [id number] Site # [id number] Subject ID # [id number]         ELIGIBILITY CRITERIA WORKSHEET INCLUSION CRITERIA   Subject has provided informed consent prior to the initiation of any study specific activities/procedures _14   Age 43 to 17 years _15   MI (presumed type 1) OR _16   PCI (with high-risk features) with at least 1 of the following: _17   Age >65 _18   Diabetes mellitus  HbA1c: _19   History of ischemic stroke _20   History of peripheral arterial disease _21   Residual stenosis ? 50% _22   Multivessel PCI (ie, ? 2 vessels, including branch arteries _23   EXCLUSIONS THE FOLLOWING N/A _24   Subjects known to be currently receiving investigational drug in a clinical study that is anticipated to last > 1 year _25   Known Lp(a) value <16m/dL or < 200nmol/L _26   Subject has a diagnosis of end-stage renal disease or requires dialysis. _27   Poorly controlled (glycated hemoglobin [HbA1c] > 10%) diabetes mellitus (type 1 or type 2) _28   Subject is receiving or has received lipoprotein apheresis to reduce Lp(a) within 3 months prior to enrollment. _29   Known uncontrolled or recurrent ventricular tachycardia in the past 3 months prior to enrollment. _30   Known malignancy (except non-melanoma skin cancers, cervical in situ carcinoma, breast ductal carcinoma in situ, or stage 1 prostate carcinoma) within the last 5 years prior to enrollment. _31   Known  history or evidence of clinically significant disease (eg, respiratory, gastrointestinal, or psychiatric disease) or unstable disorder or biomarker that, in the opinion of the investigator(s), would result in life expectancy < 5 years. _32   Known hemorrhagic stroke. _33    AMGEN Lp(a) Informed Consent   Subject Name: Francisco Juarez  Subject met inclusion and exclusion criteria.  The informed consent form, study requirements and expectations were reviewed with the subject and questions and concerns were addressed prior to the signing of the consent form.  The subject verbalized understanding of the trial requirements.  The subject agreed to participate [facility] and signed the informed consent at 1126 on 07/27/2020.  The informed consent was obtained prior  to performance of any protocol-specific procedures for the subject.  A copy of the signed informed consent was given to the subject and a copy was placed in the subject's medical record.   Francisco Juarez M Francisco Juarez  Amg[employer] Consent Version 2 Protocol Version 2  

## 2020-07-27 NOTE — Progress Notes (Signed)
Office Visit    Patient Name: Francisco Juarez Date of Encounter: 07/27/2020  PCP:  Aviva Kluver   [facility]  Cardiologist:  Armanda Magic, MD  Advanced Practice Provider:  No care team member to display Electrophysiologist:  None   Chief Complaint    Francisco Juarez is a 50 y.o. male with a hx of hypertension, hyperlipidemia, tobacco use, CAD s/p NSTEMI presents today for hospital follow-up  Past Medical History    Past Medical History:  Diagnosis Date   Chronic pain in left shoulder    Hyperlipidemia    Hypertension    Leukocytosis 11/20/2013   referred to heamtology in 2015   Polyarthralgia    referred to rheum in 20a5   Past Surgical History:  Procedure Laterality Date   ANTERIOR CRUCIATE LIGAMENT REPAIR     left knee   APPENDECTOMY     FOOT SURGERY     screw right foot 5th metatarsal   INTRAVASCULAR PRESSURE WIRE/FFR STUDY N/A 07/06/2020   Procedure: INTRAVASCULAR PRESSURE WIRE/FFR STUDY;  Surgeon: Lyn Records, MD;  Location: [facility] INVASIVE CV LAB;  Service: Cardiovascular;  Laterality: N/A;   KNEE SURGERY     left knee ligaments repair   LEFT HEART CATH AND CORONARY ANGIOGRAPHY N/A 07/06/2020   Procedure: LEFT HEART CATH AND CORONARY ANGIOGRAPHY;  Surgeon: Lyn Records, MD;  Location: [facility] INVASIVE CV LAB;  Service: Cardiovascular;  Laterality: N/A;    Allergies  No Known Allergies  History of Present Illness    Francisco Juarez is a 50 y.o. male with a hx of hypertension, hyperlipidemia, tobacco use, CAD s/p NSTEMI last seen while hospitalized.  He presented to the hospital 07/02/20 and was diagnosed with NSTEMI.  Echocardiogram 07/02/2020 LVEF 60 to 65%, no R WMA, normal diastolic parameters, trivial MR.  Underwent cardiac catheterization 07/06/2020 showing 50-80% stenosis in mid to distal nondominant RCA, second twos marginal, 50% proximal to mid ramus, 30 to 70% mid LAD percent, 90% ostial diagonal #1, 70% LAD with RFR  0.93.  The first diagonal was not treated as it could potentially involve LAD.  Given young age he was recommended for medical management.  LVEDP 19 mmHg consistent with diastolic heart failure presumed due to poorly controlled hypertension.  He was recommended for aggressive antianginal therapy and if angina remains limiting symptom after optimization and consideration of diagonal PCI would be appropriate.  He presents today for follow-up with his wife. 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 Sinc eMI has taken more managerial role. We reviewed cardiac testing and medications in detail. He tells me he had 2 episodes of chest pain. One on Friday and the other two Fridays before that. These occurred while getting ready in the morning. Tells me the last one happened when he "got a little bit upset". Tells me this not recurred since last Friday. He has picked up his walking speed without dyspnea or recurrent chest pain. Blood pressure at home he has been monitoring his blood pressure with readings 130-135/70 by wrist cuff. Has sleep study in 2013 but uncertain of result. Wakes up feeling tired and does snore.   EKGs/Labs/Other Studies Reviewed:   The following studies were reviewed today:  Echo 07/02/2020  1. Left ventricular ejection fraction, by estimation, is 60 to 65%. The  left ventricle has normal function. The left ventricle has no regional  wall motion abnormalities. Left ventricular diastolic parameters were  normal.   2.  Right ventricular systolic function is normal. The right ventricular  size is normal. Tricuspid regurgitation signal is inadequate for assessing  PA pressure.   3. The mitral valve is normal in structure. Trivial mitral valve  regurgitation. No evidence of mitral stenosis.   4. The aortic valve is normal in structure. Aortic valve regurgitation is  not visualized. No aortic stenosis is present.   5. The inferior vena cava is normal in size with greater than 50%   respiratory variability, suggesting right atrial pressure of 3 mmHg.   LHC 07/06/2020 Conclusion  Diffuse 60 to 80% stenosis in the mid to distal nondominant RCA, second obtuse marginal, 50% proximal to mid ramus, 30 to 70% mid LAD, and 90% ostial diagonal #1 (relatively large vessel and possibly the culprit). 70% mid LAD RFR 0.93 (abnormal less than 0.89) First diagonal was not treated as it could potentially involve the LAD.  The best approach given this patient's young age is aggressive medical management of angina. Normal LV systolic function with EF estimated to be 50 to 55%.  LVEDP 19 mmHg, consistent with diastolic heart failure likely related to Poorly controlled blood pressure.   RECOMMENDATIONS:   Aggressive antianginal therapy.  Once optimized, if angina remains a limiting symptom, consideration of diagonal PCI would then be appropriate. Aggressive preventive therapy: Blood pressure control; smoking cessation; LDL target less than 55. Started Plavix in addition to aspirin which should be continued for at least 6 months. Wean and DC nitroglycerin today.   Coronary Diagrams   Diagnostic Dominance: Left      EKG: No EKG today  Recent Labs: 07/03/2020: Magnesium 2.1 07/05/2020: BUN 11; Potassium 4.3; Sodium 139 07/06/2020: Creatinine, Ser 1.04; Hemoglobin 14.4; Platelets 259  Recent Lipid Panel    Component Value Date/Time   CHOL 208 (H) 07/02/2020 0757   TRIG 106 07/02/2020 0757   HDL 32 (L) 07/02/2020 0757   CHOLHDL 6.5 07/02/2020 0757   VLDL 21 07/02/2020 0757   LDLCALC 155 (H) 07/02/2020 0757   Home Medications   Current Meds  Medication Sig   isosorbide mononitrate (IMDUR) 30 MG 24 hr tablet Take 1 tablet (30 mg total) by mouth daily.   metoprolol tartrate (LOPRESSOR) 25 MG tablet Take 0.5 tablets (12.5 mg total) by mouth 2 (two) times daily.   [DISCONTINUED] metoprolol tartrate (LOPRESSOR) 25 MG tablet Take 0.5 tablets (12.5 mg total) by mouth 2 (two) times  daily.     Review of Systems      All other systems reviewed and are otherwise negative except as noted above.  Physical Exam    VS:  BP 128/78 (BP Location: Left Arm, Patient Position: Sitting, Cuff Size: Normal)   Pulse 67   Ht 5\' 7"  (1.702 m)   Wt 172 lb 6.4 oz (78.2 kg)   SpO2 99%   BMI 27.00 kg/m  , BMI Body mass index is 27 kg/m.  Wt Readings from Last 3 Encounters:  07/27/20 172 lb 6.4 oz (78.2 kg)  07/02/20 175 lb (79.4 kg)  10/14/14 176 lb 3.2 oz (79.9 kg)     GEN: Well nourished, well developed, in no acute distress. HEENT: normal. Neck: Supple, no JVD, carotid bruits, or masses. Cardiac: RRR, no murmurs, rubs, or gallops. No clubbing, cyanosis, edema. Radials/PT 2+ and equal bilaterally.  Respiratory:  Respirations regular and unlabored, clear to auscultation bilaterally. GI: Soft, nontender, nondistended. MS: No deformity or atrophy. Skin: Warm and dry, no rash.R radial cath site healing well.  Neuro:  Strength and sensation are intact. Psych: Normal affect.  Assessment & Plan    CAD - Reports 2 epidoes of chest pain since discharge. LHC 07/06/20 with first diagonal 90% stenosis but given young age and involvement of LAD recommended for medial therapy with consideration of diagonal PCI if recurrent angina despite maximal antianginal therapy. Continue aspirin, Plavix, metoprolol, PRN nitroglycerin. Start Imdur 30mg  QD. Encouraged to participate in cardiac rehab.   HLD, LDL goal <70, ideally <55 - Participating in research study and lipoprotein A collected today. Atorvastatin 80mg  daily started during admission. CMP, lipid panel in 6 weeks. If LDL not at goal, plan to add Zetia. If lipoprotein A elevated, consider PCSK9i.  HTN - BP well controlled. Continue current antihypertensive regimen.    Snores / sleep disordered breathing - Reports snoring and non restorative sleep. Additional risk factors for OSA include HTN. STOP bang score of 5. Sleep study ordered.    Disposition: Follow up in 2 month(s) with Dr. or APP.  Signed, , NP 07/27/2020, 9:03 PM [facility]

## 2020-07-27 NOTE — Patient Instructions (Addendum)
Medication Instructions:  Your physician has recommended you make the following change in your medication:   START Isosorbide Mononitrate (Imdur) one 30mg  tablet  *If you need a refill on your cardiac medications before your next appointment, please call your pharmacy*   Lab Work: Your physician recommends that you return for lab work in 6 weeks for lipid panel, CMP  If you have labs (blood work) drawn today and your tests are completely normal, you will receive your results only by: MyChart Message (if you have MyChart) OR A paper copy in the mail If you have any lab test that is abnormal or we need to change your treatment, we will call you to review the results.   Testing/Procedures: Your physician has recommended that you have a sleep study. This test records several body functions during sleep, including: brain activity, eye movement, oxygen and carbon dioxide blood levels, heart rate and rhythm, breathing rate and rhythm, the flow of air through your mouth and nose, snoring, body muscle movements, and chest and belly movement.   Follow-Up: At [facility], you and your health needs are our priority.  As part of our continuing mission to provide you with exceptional heart care, we have created designated Provider Care Teams.  These Care Teams include your primary Cardiologist (physician) and Advanced Practice Providers (APPs -  Physician Assistants and Nurse Practitioners) who all work together to provide you with the care you need, when you need it.  We recommend signing up for the patient portal called "MyChart".  Sign up information is provided on this After Visit Summary.  MyChart is used to connect with patients for Virtual Visits (Telemedicine).  Patients are able to view lab/test results, encounter notes, upcoming appointments, etc.  Non-urgent messages can be sent to your provider as well.   To learn more about what you can do with MyChart, go to [facility].     Your next appointment:   2 month(s)  The format for your next appointment:   In Person  Provider:   You may see [email or web address], NP or one of the following Advanced Practice Providers on your designated Care Team:    Other Instructions  Heart Healthy Diet Recommendations: A low-salt diet is recommended. Meats should be grilled, baked, or boiled. Avoid fried foods. Focus on lean protein sources like fish or chicken with vegetables and fruits. The American Heart Association is a GREAT resourc  Exercise recommendations: The American Heart Association recommends 150 minutes of moderate intensity exercise weekly. Try 30 minutes of moderate intensity exercise 4-5 times per week. This could include walking, jogging, or swimming.   Recommend establishing with a primary care provider.  You may call [facility] @ [phone] for a list of primary care providers in your area or visit their website [phone] Please have any insurance card available before calling or going online.    Prediabetes Eating Plan Prediabetes is a condition that causes blood sugar (glucose) levels to be higher than normal. This increases the risk for developing type 2 diabetes (type 2 diabetes mellitus). Working with a health care provider or nutrition specialist (dietitian) to make diet and lifestyle changes can help prevent the onset of diabetes. These changes may help you: Control your blood glucose levels. Improve your cholesterol levels. Manage your blood pressure. What are tips for following this plan? Reading food labels Read food labels to check the amount of fat, salt (sodium), and sugar in prepackaged foods. Avoid foods that have: Saturated fats. Trans  fats. Added sugars. Avoid foods that have more than 300 milligrams (mg) of sodium per serving. Limit your sodium intake to less than 2,300 mg each day. Shopping Avoid buying pre-made and processed foods. Avoid buying  drinks with added sugar. Cooking Cook with olive oil. Do not use butter, lard, or ghee. Bake, broil, grill, steam, or boil foods. Avoid frying. Meal planning  Work with your dietitian to create an eating plan that is right for you. This may include tracking how many calories you take in each day. Use a food diary, notebook, or mobile application to track what you eat at each meal. Consider following a Mediterranean diet. This includes: Eating several servings of fresh fruits and vegetables each day. Eating fish at least twice a week. Eating one serving each day of whole grains, beans, nuts, and seeds. Using olive oil instead of other fats. Limiting alcohol. Limiting red meat. Using nonfat or low-fat dairy products. Consider following a plant-based diet. This includes dietary choices that focus on eating mostly vegetables and fruit, grains, beans, nuts, and seeds. If you have high blood pressure, you may need to limit your sodium intake or follow a diet such as the DASH (Dietary Approaches to Stop Hypertension) eating plan. The DASH diet aims to lower high blood pressure.  Lifestyle Set weight loss goals with help from your health care team. It is recommended that most people with prediabetes lose 7% of their body weight. Exercise for at least 30 minutes 5 or more days a week. Attend a support group or seek support from a mental health counselor. Take over-the-counter and prescription medicines only as told by your health care provider. What foods are recommended? Fruits Berries. Bananas. Apples. Oranges. Grapes. Papaya. Mango. Pomegranate. Kiwi.Grapefruit. Cherries. Vegetables Lettuce. Spinach. Peas. Beets. Cauliflower. Cabbage. Broccoli. Carrots.Tomatoes. Squash. Eggplant. Herbs. Peppers. Onions. Cucumbers. Brussels sprouts. Grains Whole grains, such as whole-wheat or whole-grain breads, crackers, cereals, and pasta. Unsweetened oatmeal. Bulgur. Barley. Quinoa. Brown rice. Corn  orwhole-wheat flour tortillas or taco shells. Meats and other proteins Seafood. Poultry without skin. Lean cuts of pork and beef. Tofu. Eggs. Nuts.Beans. Dairy Low-fat or fat-free dairy products, such as yogurt, cottage cheese, and cheese. Beverages Water. Tea. Coffee. Sugar-free or diet soda. Seltzer water. Low-fat or nonfatmilk. Milk alternatives, such as soy or almond milk. Fats and oils Olive oil. Canola oil. Sunflower oil. Grapeseed oil. Avocado. Walnuts. Sweets and desserts Sugar-free or low-fat pudding. Sugar-free or low-fat ice cream and other frozentreats. Seasonings and condiments Herbs. Sodium-free spices. Mustard. Relish. Low-salt, low-sugar ketchup.Low-salt, low-sugar barbecue sauce. Low-fat or fat-free mayonnaise. The items listed above may not be a complete list of recommended foods and beverages. Contact a dietitian for more information. What foods are not recommended? Fruits Fruits canned with syrup. Vegetables Canned vegetables. Frozen vegetables with butter or cream sauce. Grains Refined white flour and flour products, such as bread, pasta, snack foods, andcereals. Meats and other proteins Fatty cuts of meat. Poultry with skin. Breaded or fried meat. Processed meats. Dairy Full-fat yogurt, cheese, or milk. Beverages Sweetened drinks, such as iced tea and soda. Fats and oils Butter. Lard. Ghee. Sweets and desserts Baked goods, such as cake, cupcakes, pastries, cookies, and cheesecake. Seasonings and condiments Spice mixes with added salt. Ketchup. Barbecue sauce. Mayonnaise. The items listed above may not be a complete list of foods and beverages that are not recommended. Contact a dietitian for more information. Where to find more information American Diabetes Association: www.diabetes.org Summary You may need to make diet  and lifestyle changes to help prevent the onset of diabetes. These changes can help you control blood sugar, improve cholesterol levels, and  manage blood pressure. Set weight loss goals with help from your health care team. It is recommended that most people with prediabetes lose 7% of their body weight. Consider following a Mediterranean diet. This includes eating plenty of fresh fruits and vegetables, whole grains, beans, nuts, seeds, fish, and low-fat dairy, and using olive oil instead of other fats. This information is not intended to replace advice given to you by your health care provider. Make sure you discuss any questions you have with your healthcare provider. Document Revised: 03/20/2019 Document Reviewed: 03/20/2019 Elsevier Patient Education  2022 [employer].

## 2020-07-28 LAB — LIPOPROTEIN A (LPA): Lipoprotein (a): 180.8 nmol/L — ABNORMAL HIGH (ref <75.0)

## 2020-08-02 ENCOUNTER — Telehealth: Payer: 59 | Admitting: *Deleted

## 2020-08-02 DIAGNOSIS — G4730 Sleep apnea, unspecified: Secondary | ICD-10-CM

## 2020-08-02 NOTE — Telephone Encounter (Signed)
Prior Authorization for HST sent to [facility] via Fax [phone].

## 2020-08-02 NOTE — Telephone Encounter (Signed)
-----   Message from Gaynelle Cage, [place] sent at 07/28/2020  9:52 AM EDT -----  ----- Message ----- From: Debbe Bales Sent: 07/27/2020   2:03 PM EDT To: [facility]  This pt needs a sleep study for snoring and is a Dr. Mayford Knife patient.  I put in for a home sleep study please.   Francisco Juarez [phone]  Thanks  danielle

## 2020-08-10 ENCOUNTER — Other Ambulatory Visit: Payer: Self-pay

## 2020-08-10 DIAGNOSIS — Z006 Encounter for examination for normal comparison and control in clinical research program: Secondary | ICD-10-CM

## 2020-08-10 NOTE — Research (Signed)
ORION 4 Informed Consent   Subject Name: Francisco Juarez  Subject met inclusion and exclusion criteria.  The informed consent form, study requirements and expectations were reviewed with the subject and questions and concerns were addressed prior to the signing of the consent form.  The subject verbalized understanding of the trial requirements.  The subject agreed to participate in the ORION 4 trial and signed the informed consent at 0850 on 08/10/2020.  The informed consent was obtained prior to performance of any protocol-specific procedures for the subject.  A copy of the signed informed consent was given to the subject and a copy was placed in the subject's medical record.   Cherene Altes Tamanika Heiney   Protocol version 7.0 Consent version 3  ORION 4 SUBJECT EL#[id number]           DEMOGRAPHICS SEX '[x]'    MALE           '[]'   MALE  EHTINCITY '[]'     HISPANIC OR LATINO '[x]'     NON-HISPANIC OR LATINO  RACE '[]'     WHITE         '[]'    ASIAN '[x]'     BLACK OR AFRICAN           AMERICAN '[]'     AMERICAN INDIAN OR         [place] NATIVE '[]'     NATIVE [place] OR           OTHER [place] ISLANDER '[]'     OTHER          OTHER   AGE 50  FUTURE RESEARCH '[]'      NO            '[x]'     YES  Pharmacogenetics Research    '[]'      NO            '[x]'     YES  BIOMARKER '[]'      NO            '[x]'     YES    ORION 4 SUBJECT ID#   INCLUSION/EXCLUSION                                           INCLUSIONS                                YES      NO  SIGNED INFORMED CONSENT '[x]'  '[]'   MEN ? 40          WOMEN ? 55            AGE:61 '[x]'  '[]'   PRIOR MI, ISCHEMIC STROKE OR PERIPHERAL ARTERY DISEASE AS EVIDENT BY PRIOR LOWER EXTREMITY ARTERY REVASCULARIZATION OR AORTIC ANEURYSM REPAIR '[x]'  '[]'   TOTAL CHOLESTEROL ? 173m/dL       RESULT:<1048mdL @ screening visit   Previous cholesterol: 20881mL on 07/02/20 '[]'  '[x]'                                          EXCLUSIONS                 N/A '[x]'   ACUTE CORONARY SYNDROME OR STROKE LESS THAN 4  WKS PRIOR '[]'  '[]'   CORONARY REVASCULARIZATION PROCEDURE PLANNED  WITHIN THE NEXT 6 MONTHS '[]'  '[]'   CHRONIC LIVER DISEASE '[]'  '[]'   CURRENT OR PLANNED RENAL DIALYSIS OR TRANSPLANT '[]'  '[]'   PREVIOUS EXPOSURE TO INCLISIRAN OR PARTICIPATION IN A RANDOMINIZED TRIAL OF INCLISIRAN '[]'  '[]'   PREVIOUS (WITHIN ABOUT 3 MONTHS), CURRENT OR PLANNED TREATMENT WITH MONOCLONAL ANTIBODY TARGETING PCSK9 '[]'  '[]'   KNOWN TO BE POORLY COMPLIANT WITH CLINIC VISITS OR PRESCRIBED MEDICATIONS '[]'  '[]'   MEDICAL HISTORY THAT MIGHT LIMIT ABILITY TO TAKE TRIAL TREATMENTST FOR THE DURATION OF THE STUDY, (EX. SEVERE RESPIRATORY DISEASE, CANCER OR EVIDENCE OF SPREAD WITHIN THE LAST 5 YEARS, HX OF ALCOHOL OR SUBSTANCE ABUSE) '[]'  '[]'   WOMEN OF CHILD-BEARING POTENTIAL, CURRENT PREGNANCY OR LACATION '[]'  '[]'   PARTICIPATION IN A CLINICAL TRIAL WITH AN UNLICENSED DRUG OR DEVICE '[]'  '[]'

## 2020-08-23 ENCOUNTER — Encounter (HOSPITAL_BASED_OUTPATIENT_CLINIC_OR_DEPARTMENT_OTHER): Payer: Self-pay

## 2020-09-06 NOTE — Progress Notes (Signed)
Office Visit    Patient Name: Reshaun Briseno III Date of Encounter: 09/09/2020  PCP:  Aviva Kluver   [facility]  Cardiologist:  Armanda Magic, MD  Advanced Practice Provider:  No care team member to display Electrophysiologist:  None   Chief Complaint    Dozier Berkovich III is a 50 y.o. male with a hx of hypertension, hyperlipidemia, tobacco use, CAD s/p NSTEMI presents today for follow-up of CAD and HLD  Past Medical History    Past Medical History:  Diagnosis Date   Chronic pain in left shoulder    Hyperlipidemia    Hypertension    Leukocytosis 11/20/2013   referred to heamtology in 2015   Polyarthralgia    referred to rheum in 20a5   Past Surgical History:  Procedure Laterality Date   ANTERIOR CRUCIATE LIGAMENT REPAIR     left knee   APPENDECTOMY     FOOT SURGERY     screw right foot 5th metatarsal   INTRAVASCULAR PRESSURE WIRE/FFR STUDY N/A 07/06/2020   Procedure: INTRAVASCULAR PRESSURE WIRE/FFR STUDY;  Surgeon: Lyn Records, MD;  [facility]: [facility];  Service: Cardiovascular;  Laterality: N/A;   KNEE SURGERY     left knee ligaments repair   LEFT HEART CATH AND CORONARY ANGIOGRAPHY N/A 07/06/2020   Procedure: LEFT HEART CATH AND CORONARY ANGIOGRAPHY;  Surgeon: Lyn Records, MD;  Location: [facility];  Service: Cardiovascular;  Laterality: N/A;    Allergies  No Known Allergies  History of Present Illness    Aniketh Huberty III is a 50 y.o. male with a hx of hypertension, hyperlipidemia, tobacco use, CAD s/p NSTEMI last seen 07/27/20.  He presented to the hospital 07/02/20 and was diagnosed with NSTEMI.  Echocardiogram 07/02/2020 LVEF 60 to 65%, no R WMA, normal diastolic parameters, trivial MR.  Underwent cardiac catheterization 07/06/2020 showing 50-80% stenosis in mid to distal nondominant RCA, second twos marginal, 50% proximal to mid ramus, 30 to 70% mid LAD percent, 90% ostial diagonal #1, 70% LAD with RFR 0.93.   The first diagonal was not treated as it could potentially involve LAD.  Given young age he was recommended for medical management.  LVEDP 19 mmHg consistent with diastolic heart failure presumed due to poorly controlled hypertension.  He was recommended for aggressive antianginal therapy and if angina remains limiting symptom after optimization and consideration of diagonal PCI would be appropriate.  Seen 07/27/20. Noted a few episodes of chest discomfort and Imdur initiated. He had lipoprotein a collected as part of a research study which resulted at 180.   Repeat lipid panel 09/08/20 total cholesterol 96, HDL 28, LDL 52, triglycerides 78.   Presents today for follow up. 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  He is looking forward to going to see his 69 year old granddaughter play volleyball tonight.  He does note that first thing in the morning he will feel as if his heart is "jumping ".  He has taken nitroglycerin on occasion with relief after 1 tablet.  Tells me it feels like someone slipped him upside down and all the blood rushed to his head.  Does note he jumps out of bed pretty quickly to get ready in the morning.  His blood pressure has been elevated in the systolic 140s at home but was checking with wrist cuff and discussed that these  are not as accurate.    In regards to medications, he takes losartan 100 mg along with Imdur 30 mg, Toprol tartrate 12.5 mg in the morning around 6 AM.  He then takes his evening dose of metoprolol around 9 PM.   EKGs/Labs/Other Studies Reviewed:   The following studies were reviewed today:  Echo 07/02/2020  1. Left ventricular ejection fraction, by estimation, is 60 to 65%. The  left ventricle has normal function. The left ventricle has no regional  wall motion abnormalities. Left ventricular diastolic parameters were  normal.   2. Right ventricular systolic  function is normal. The right ventricular  size is normal. Tricuspid regurgitation signal is inadequate for assessing  PA pressure.   3. The mitral valve is normal in structure. Trivial mitral valve  regurgitation. No evidence of mitral stenosis.   4. The aortic valve is normal in structure. Aortic valve regurgitation is  not visualized. No aortic stenosis is present.   5. The inferior vena cava is normal in size with greater than 50%  respiratory variability, suggesting right atrial pressure of 3 mmHg.   LHC 07/06/2020 Conclusion  Diffuse 60 to 80% stenosis in the mid to distal nondominant RCA, second obtuse marginal, 50% proximal to mid ramus, 30 to 70% mid LAD, and 90% ostial diagonal #1 (relatively large vessel and possibly the culprit). 70% mid LAD RFR 0.93 (abnormal less than 0.89) First diagonal was not treated as it could potentially involve the LAD.  The best approach given this patient's young age is aggressive medical management of angina. Normal LV systolic function with EF estimated to be 50 to 55%.  LVEDP 19 mmHg, consistent with diastolic heart failure likely related to Poorly controlled blood pressure.   RECOMMENDATIONS:   Aggressive antianginal therapy.  Once optimized, if angina remains a limiting symptom, consideration of diagonal PCI would then be appropriate. Aggressive preventive therapy: Blood pressure control; smoking cessation; LDL target less than 55. Started Plavix in addition to aspirin which should be continued for at least 6 months. Wean and DC nitroglycerin today.   Coronary Diagrams   Diagnostic Dominance: Left      EKG: EKG ordered today. EKG performed today demonstrates NSR 65 bpm with stable inferior Q waves and no acute ST/T wave changes.   Recent Labs: 07/03/2020: Magnesium 2.1 07/06/2020: Hemoglobin 14.4; Platelets 259 09/08/2020: ALT 18; BUN 10; Creatinine, Ser 1.09; Potassium 4.6; Sodium 141  Recent Lipid Panel    Component Value Date/Time    CHOL 96 (L) 09/08/2020 0809   TRIG 78 09/08/2020 0809   HDL 28 (L) 09/08/2020 0809   CHOLHDL 3.4 09/08/2020 0809   CHOLHDL 6.5 07/02/2020 0757   VLDL 21 07/02/2020 0757   LDLCALC 52 09/08/2020 0809   Home Medications   Current Meds  Medication Sig   aspirin EC 81 MG EC tablet Take 1 tablet (81 mg total) by mouth daily. Swallow whole.   atorvastatin (LIPITOR) 80 MG tablet Take 1 tablet (80 mg total) by mouth daily.   clopidogrel (PLAVIX) 75 MG tablet Take 1 tablet (75 mg total) by mouth daily.   isosorbide mononitrate (IMDUR) 30 MG 24 hr tablet Take 1 tablet (30 mg total) by mouth daily.   losartan (COZAAR) 50 MG tablet Take 1 tablet (50 mg total) by mouth daily.   metoprolol tartrate (LOPRESSOR) 25 MG tablet Take 0.5 tablets (12.5 mg total) by mouth 2 (two) times daily.   nitroGLYCERIN (NITROSTAT) 0.4 MG SL tablet Place 1 tablet (0.4 mg total)  under the tongue every 5 (five) minutes as needed for chest pain.     Review of Systems      All other systems reviewed and are otherwise negative except as noted above.  Physical Exam    VS:  BP (!) 192/100   Pulse 65   Ht 5\' 7"  (1.702 m)   Wt 170 lb 4.8 oz (77.2 kg)   SpO2 99%   BMI 26.67 kg/m  , BMI Body mass index is 26.67 kg/m.  Wt Readings from Last 3 Encounters:  09/09/20 170 lb 4.8 oz (77.2 kg)  07/27/20 172 lb 6.4 oz (78.2 kg)  07/02/20 175 lb (79.4 kg)    GEN: Well nourished, well developed, in no acute distress. HEENT: normal. Neck: Supple, no JVD, carotid bruits, or masses. Cardiac: RRR, no murmurs, rubs, or gallops. No clubbing, cyanosis, edema. Radials/PT 2+ and equal bilaterally.  Respiratory:  Respirations regular and unlabored, clear to auscultation bilaterally. GI: Soft, nontender, nondistended. MS: No deformity or atrophy. Skin: Warm and dry, no rash. Neuro:  Strength and sensation are intact. Psych: Normal affect.  Assessment & Plan    CAD -  LHC 07/06/20 with first diagonal 90% stenosis but given young  age and involvement of LAD recommended for medial therapy with consideration of diagonal PCI if recurrent angina despite maximal antianginal therapy. Continue aspirin, Plavix, Imdur, Metoprolol, PRN Nitroglycerin. Given elevated BP, episodes of "heart pounding" first thing in the morning - will increase Imdur to 60mg  and have him take QHS. He reports no exertional symptoms with his work in 09/06/20. Heart healthy diet and regular cardiovascular exercise encouraged.    HLD, LDL goal <70, ideally <55 / Familial hyperlipidemia - Lp(a) 180. Atorvastatin 80mg  daily started during admission. Repeat lipid panel 09/08/20 total cholesterol 96, HDL 28, LDL 52, triglycerides 78. Continue Atorvastatin 80mg  daily.  HTN - BP elevated. Continue Lopressor 12.5mg  BID, Losartan 100mg  QD. Increase Imdur to 60mg  QHS. Encouraged to purchase arm cuff instead of wrist cuff.  Snores / sleep disordered breathing - Reports snoring and non restorative sleep. Additional risk factors for OSA include HTN. STOP bang score of 5. Sleep study previously ordered and scheduled for tomorrow.  Disposition: Follow up in 3 months with Dr. Holiday representative or APP.  Signed, , NP 09/09/2020, 8:04 AM [facility]

## 2020-09-07 ENCOUNTER — Telehealth (HOSPITAL_BASED_OUTPATIENT_CLINIC_OR_DEPARTMENT_OTHER): Payer: Self-pay

## 2020-09-07 NOTE — Telephone Encounter (Signed)
Call to patient to remind him of need for fasting labwork, prior to appt on 09/09/2020 if possible. Reviewed "fasting."  Patient stated he would go 09/08/2020.

## 2020-09-08 LAB — COMPREHENSIVE METABOLIC PANEL
ALT: 18 IU/L (ref 0–44)
AST: 19 IU/L (ref 0–40)
Albumin/Globulin Ratio: 2.5 — ABNORMAL HIGH (ref 1.2–2.2)
Albumin: 4.7 g/dL (ref 4.0–5.0)
Alkaline Phosphatase: 120 IU/L (ref 44–121)
BUN/Creatinine Ratio: 9 (ref 9–20)
BUN: 10 mg/dL (ref 6–24)
Bilirubin Total: 0.6 mg/dL (ref 0.0–1.2)
CO2: 21 mmol/L (ref 20–29)
Calcium: 9.6 mg/dL (ref 8.7–10.2)
Chloride: 105 mmol/L (ref 96–106)
Creatinine, Ser: 1.09 mg/dL (ref 0.76–1.27)
Globulin, Total: 1.9 g/dL (ref 1.5–4.5)
Glucose: 94 mg/dL (ref 65–99)
Potassium: 4.6 mmol/L (ref 3.5–5.2)
Sodium: 141 mmol/L (ref 134–144)
Total Protein: 6.6 g/dL (ref 6.0–8.5)
eGFR: 84 mL/min/{1.73_m2} (ref >59)

## 2020-09-08 LAB — LIPID PANEL
Chol/HDL Ratio: 3.4 ratio (ref 0.0–5.0)
Cholesterol, Total: 96 mg/dL — ABNORMAL LOW (ref 100–199)
HDL: 28 mg/dL — ABNORMAL LOW (ref >39)
LDL Chol Calc (NIH): 52 mg/dL (ref 0–99)
Triglycerides: 78 mg/dL (ref 0–149)
VLDL Cholesterol Cal: 16 mg/dL (ref 5–40)

## 2020-09-09 ENCOUNTER — Other Ambulatory Visit: Payer: Self-pay

## 2020-09-09 ENCOUNTER — Encounter (HOSPITAL_BASED_OUTPATIENT_CLINIC_OR_DEPARTMENT_OTHER): Payer: Self-pay | Admitting: Family

## 2020-09-09 ENCOUNTER — Ambulatory Visit (INDEPENDENT_AMBULATORY_CARE_PROVIDER_SITE_OTHER): Payer: 59 | Admitting: Family

## 2020-09-09 VITALS — BP 156/80 | HR 65 | Ht 67.0 in | Wt 170.3 lb

## 2020-09-09 DIAGNOSIS — I25118 Atherosclerotic heart disease of native coronary artery with other forms of angina pectoris: Secondary | ICD-10-CM

## 2020-09-09 DIAGNOSIS — E785 Hyperlipidemia, unspecified: Secondary | ICD-10-CM | POA: Diagnosis not present

## 2020-09-09 DIAGNOSIS — I10 Essential (primary) hypertension: Secondary | ICD-10-CM

## 2020-09-09 DIAGNOSIS — G4730 Sleep apnea, unspecified: Secondary | ICD-10-CM | POA: Diagnosis not present

## 2020-09-09 MED ORDER — LOSARTAN POTASSIUM 100 MG PO TABS: 100.0000 mg | ORAL_TABLET | Freq: Every day | ORAL | 3 refills | Status: DC

## 2020-09-09 MED ORDER — ISOSORBIDE MONONITRATE ER 60 MG PO TB24: 60.0000 mg | ORAL_TABLET | Freq: Every day | ORAL | 3 refills | Status: DC

## 2020-09-09 NOTE — Patient Instructions (Addendum)
Medication Instructions:  Your physician has recommended you make the following change in your medication:   CONTINUE Losartan to 100mg  daily  *You can use up your 50mg  tablets by taking two together. When you pick up the updated prescription, it is stronger - so just one tablet at that time.   CHANGE Isosorbide Mononitrate (Imdur) to 60mg  daily in the evening.  *You can use up your 30mg  tablets by taking two together. When you pick up the updated prescription, it is stronger - so just one tablet at that time.  *If you need a refill on your cardiac medications before your next appointment, please call your pharmacy*  Lab Work: None ordered today. Your recent cholesterol panel is detailed below. It looked really good!  Type Previous New Goal  Total Cholesterol 208 96 <200  HDL 32 28 >40  LDL 155 52 (ideally <55)  Triglycerides 106 78 <150          Testing/Procedures: None ordered today  Follow-Up: At [facility], you and your health needs are our priority.  As part of our continuing mission to provide you with exceptional heart care, we have created designated Provider Care Teams.  These Care Teams include your primary Cardiologist (physician) and Advanced Practice Providers (APPs -  Physician Assistants and Nurse Practitioners) who all work together to provide you with the care you need, when you need it.  We recommend signing up for the patient portal called "MyChart".  Sign up information is provided on this After Visit Summary.  MyChart is used to connect with patients for Virtual Visits (Telemedicine).  Patients are able to view lab/test results, encounter notes, upcoming appointments, etc.  Non-urgent messages can be sent to your provider as well.   To learn more about what you can do with MyChart, go to .    Your next appointment:   In 3 months  The format for your next appointment:   12/10/20 at 8:40AM with Dr. <16   Other  Instructions  Heart Healthy Diet Recommendations: A low-salt diet is recommended. Meats should be grilled, baked, or boiled. Avoid fried foods. Focus on lean protein sources like fish or chicken with vegetables and fruits. The American Heart Association is a [facility]!   _____________________________________  Exercise recommendations: The American Heart Association recommends 150 minutes of moderate intensity exercise weekly. Try 30 minutes of moderate intensity exercise 4-5 times per week. This could include walking, jogging, or swimming.  _____________________________________  For coronary artery disease often called "heart disease" we aim for optimal guideline directed medical therapy. We use the "A, B, C"s to help keep [email or web address] on track!  A = Aspirin 81mg  daily B = Beta blocker which helps to relax the heart. This is your Metoprolol. C = Cholesterol control. You take Atorvastatin to help control your cholesterol.  D = Don't forget nitroglycerin! This is an emergency tablet to be used if you have chest pain. E = Extras. In your case, this is Plavix. You will take this for at least one year (until 08/07/20).  _____________________________________  Recommend establishing with a primary care provider.  You may call [facility] @ [phone] for a list of primary care providers in your area or visit their website [employer] Please have any insurance card available before calling or going online.

## 2020-09-10 ENCOUNTER — Ambulatory Visit (HOSPITAL_BASED_OUTPATIENT_CLINIC_OR_DEPARTMENT_OTHER): Payer: 59 | Attending: Family | Admitting: Internal Medicine

## 2020-09-10 DIAGNOSIS — G4733 Obstructive sleep apnea (adult) (pediatric): Secondary | ICD-10-CM | POA: Insufficient documentation

## 2020-09-10 DIAGNOSIS — E785 Hyperlipidemia, unspecified: Secondary | ICD-10-CM | POA: Diagnosis not present

## 2020-09-10 DIAGNOSIS — I25118 Atherosclerotic heart disease of native coronary artery with other forms of angina pectoris: Secondary | ICD-10-CM | POA: Diagnosis not present

## 2020-09-10 DIAGNOSIS — R0683 Snoring: Secondary | ICD-10-CM | POA: Diagnosis not present

## 2020-09-10 NOTE — Telephone Encounter (Signed)
Attempted to contact insurance but unable to reach will try again next week.

## 2020-09-13 NOTE — Telephone Encounter (Signed)
No PA required valid dates 08/02/20/-10-31-20.

## 2020-09-18 DIAGNOSIS — I25118 Atherosclerotic heart disease of native coronary artery with other forms of angina pectoris: Secondary | ICD-10-CM

## 2020-09-18 NOTE — Procedures (Signed)
     Patient Name: Francisco, Juarez Date: 09/11/2020 Gender: Male D.O.B: [birth year] Age (years): 49 Referring Provider: Alver Sorrow NP Height (inches): 68 Interpreting Physician: Jetty Duhamel MD, ABSM Weight (lbs): 170 RPSGT: [place] BMI: 26 MRN: [id number] Neck Size: 15.00  CLINICAL INFORMATION Sleep Study Type: HST Indication for sleep study: OSA Epworth Sleepiness Score: 4  SLEEP STUDY TECHNIQUE A multi-channel overnight portable sleep study was performed. The channels recorded were: nasal airflow, thoracic respiratory movement, and oxygen saturation with a pulse oximetry. Snoring was also monitored.  MEDICATIONS Patient self administered medications include: none reported.  SLEEP ARCHITECTURE Patient was studied for 300 minutes. The sleep efficiency was 99.9 % and the patient was supine for 46%. The arousal index was 0.0 per hour.  RESPIRATORY PARAMETERS The overall AHI was 8.6 per hour, with a central apnea index of 0 per hour. The oxygen nadir was 87% during sleep.  CARDIAC DATA Mean heart rate during sleep was 61.6 bpm.  IMPRESSIONS - Mild obstructive sleep apnea occurred during this study (AHI = 8.6/h). - Mild oxygen desaturation was noted during this study (Min O2 = 87%). - Patient snored.Marland Kitchen  DIAGNOSIS - Obstructive Sleep Apnea (G47.33)  RECOMMENDATIONS - Treatment for mild OSA is directed by symptoms and co-morbidities. Conservative measures may include observation, weight loss and sleep position off back. Other options, including CPAP, a fitted oral appliance, or ENT evaluation would be based on clinical judgment. - Be careful with alcohol, sedatives and other CNS depressants that may worsen sleep apnea and disrupt normal sleep architecture. - Sleep hygiene should be reviewed to assess factors that may improve sleep quality. - Weight management and regular exercise should be initiated or continued  [Electronically signed] 09/18/2020  12:38 PM  Jetty Duhamel MD, ABSM Diplomate, American Board of Sleep Medicine   NPI: [id number]                        Jetty Duhamel Diplomate, American Board of Sleep Medicine  ELECTRONICALLY SIGNED ON:  09/18/2020, 12:31 PM [facility] PH: [phone]   FX: [phone] ACCREDITED BY THE AMERICAN ACADEMY OF SLEEP MEDICINE

## 2020-09-22 NOTE — Addendum Note (Signed)
Addended by: Reesa Chew on: 09/22/2020 01:10 PM   Modules accepted: Orders

## 2020-09-22 NOTE — Telephone Encounter (Signed)
Informed patient of upcoming home sleep study and patient understanding was verbalized.  Patient understands her/his HST is scheduled for 10/1918/22 at 1. Pt is aware of testing date.

## 2020-09-24 NOTE — Telephone Encounter (Signed)
Sleep study cancelled patient already had his sleep study 09/10/20. He will have a visit with dr Mayford Knife asap.

## 2020-09-29 ENCOUNTER — Other Ambulatory Visit: Payer: Self-pay

## 2020-09-29 ENCOUNTER — Encounter: Payer: Self-pay | Admitting: Cardiology

## 2020-09-29 ENCOUNTER — Ambulatory Visit (INDEPENDENT_AMBULATORY_CARE_PROVIDER_SITE_OTHER): Payer: 59 | Admitting: Cardiology

## 2020-09-29 VITALS — BP 124/84 | HR 62 | Ht 68.0 in | Wt 171.6 lb

## 2020-09-29 DIAGNOSIS — I10 Essential (primary) hypertension: Secondary | ICD-10-CM | POA: Diagnosis not present

## 2020-09-29 DIAGNOSIS — G4733 Obstructive sleep apnea (adult) (pediatric): Secondary | ICD-10-CM

## 2020-09-29 DIAGNOSIS — I25118 Atherosclerotic heart disease of native coronary artery with other forms of angina pectoris: Secondary | ICD-10-CM | POA: Diagnosis not present

## 2020-09-29 DIAGNOSIS — E7800 Pure hypercholesterolemia, unspecified: Secondary | ICD-10-CM | POA: Diagnosis not present

## 2020-09-29 MED ORDER — ATORVASTATIN CALCIUM 80 MG PO TABS: 80.0000 mg | ORAL_TABLET | Freq: Every day | ORAL | 11 refills | Status: DC

## 2020-09-29 MED ORDER — METOPROLOL SUCCINATE ER 25 MG PO TB24: 25.0000 mg | ORAL_TABLET | Freq: Every day | ORAL | 3 refills | Status: DC

## 2020-09-29 MED ORDER — LOSARTAN POTASSIUM 100 MG PO TABS: 100.0000 mg | ORAL_TABLET | Freq: Every day | ORAL | 3 refills | Status: DC

## 2020-09-29 NOTE — Progress Notes (Signed)
Office Visit    Patient Name: Francisco Juarez Date of Encounter: 09/29/2020  PCP:  Aviva Kluver   [facility]  Cardiologist:  Armanda Magic, MD  Advanced Practice Provider:  No care team member to display Electrophysiologist:  None   Chief Complaint    Francisco Juarez is a 50 y.o. male with a hx of hypertension, hyperlipidemia, tobacco use, CAD s/p NSTEMI presents today for follow-up of CAD and HLD  Past Medical History    Past Medical History:  Diagnosis Date   Chronic pain in left shoulder    Hyperlipidemia    Hypertension    Leukocytosis 11/20/2013   referred to heamtology in 2015   Polyarthralgia    referred to rheum in 20a5   Past Surgical History:  Procedure Laterality Date   ANTERIOR CRUCIATE LIGAMENT REPAIR     left knee   APPENDECTOMY     FOOT SURGERY     screw right foot 5th metatarsal   INTRAVASCULAR PRESSURE WIRE/FFR STUDY N/A 07/06/2020   Procedure: INTRAVASCULAR PRESSURE WIRE/FFR STUDY;  Surgeon: Lyn Records, MD;  Location: [facility];  Service: Cardiovascular;  Laterality: N/A;   KNEE SURGERY     left knee ligaments repair   LEFT HEART CATH AND CORONARY ANGIOGRAPHY N/A 07/06/2020   Procedure: LEFT HEART CATH AND CORONARY ANGIOGRAPHY;  Surgeon: Lyn Records, MD;  Location: [facility];  Service: Cardiovascular;  Laterality: N/A;    Allergies  No Known Allergies  History of Present Illness    Francisco Juarez is a 50 y.o. male with a hx of hypertension, hyperlipidemia, tobacco use, CAD s/p NSTEMI.  He presented to the hospital 07/02/20 and was diagnosed with NSTEMI.  Echocardiogram 07/02/2020 LVEF 60 to 65%, no RWMA, normal diastolic parameters, trivial MR.  Underwent cardiac catheterization 07/06/2020 showing 50-80% stenosis in mid to distal nondominant RCA, 50% proximal to mid ramus, 30 to 70% mid LAD percent, 90% ostial diagonal #1, 70% LAD with RFR 0.93.  The first diagonal was not treated as  it could potentially involve LAD.  Given young age he was recommended for medical management.  LVEDP 19 mmHg consistent with diastolic heart failure presumed due to poorly controlled hypertension.  He was recommended for aggressive antianginal therapy and if angina remains limiting symptom after optimization and consideration of diagonal PCI would be appropriate.  Seen 07/27/20. Noted a few episodes of chest discomfort and Imdur initiated. He had lipoprotein a collected as part of a research study which resulted at 180.   Repeat lipid panel 09/08/20 total cholesterol 96, HDL 28, LDL 52, triglycerides 78.   He is here today for followup and is doing well.  He denies any chest pain or pressure, SOB, DOE, PND, orthopnea, LE edema, dizziness, palpitations or syncope. He is compliant with his meds and is tolerating meds with no SE.   He says that his only problem is feeling tired in the am when he gets up and exhausted in the evenings. He recently underwent HST which showed mild OSA with an AHI of 8/hr.    EKGs/Labs/Other Studies Reviewed:   The following studies were reviewed today:  Echo 07/02/2020  1. Left ventricular ejection fraction, by estimation, is 60 to 65%. The  left ventricle has normal function. The left ventricle has no regional  wall motion abnormalities. Left ventricular diastolic parameters were  normal.   2. Right ventricular systolic function is normal. The right  ventricular  size is normal. Tricuspid regurgitation signal is inadequate for assessing  PA pressure.   3. The mitral valve is normal in structure. Trivial mitral valve  regurgitation. No evidence of mitral stenosis.   4. The aortic valve is normal in structure. Aortic valve regurgitation is  not visualized. No aortic stenosis is present.   5. The inferior vena cava is normal in size with greater than 50%  respiratory variability, suggesting right atrial pressure of 3 mmHg.   LHC 07/06/2020 Conclusion  Diffuse 60 to 80%  stenosis in the mid to distal nondominant RCA, second obtuse marginal, 50% proximal to mid ramus, 30 to 70% mid LAD, and 90% ostial diagonal #1 (relatively large vessel and possibly the culprit). 70% mid LAD RFR 0.93 (abnormal less than 0.89) First diagonal was not treated as it could potentially involve the LAD.  The best approach given this patient's young age is aggressive medical management of angina. Normal LV systolic function with EF estimated to be 50 to 55%.  LVEDP 19 mmHg, consistent with diastolic heart failure likely related to Poorly controlled blood pressure.   RECOMMENDATIONS:   Aggressive antianginal therapy.  Once optimized, if angina remains a limiting symptom, consideration of diagonal PCI would then be appropriate. Aggressive preventive therapy: Blood pressure control; smoking cessation; LDL target less than 55. Started Plavix in addition to aspirin which should be continued for at least 6 months. Wean and DC nitroglycerin today.   Coronary Diagrams   Diagnostic Dominance: Left      EKG: EKG ordered today. EKG performed today demonstrates NSR 65 bpm with stable inferior Q waves and no acute ST/T wave changes.   Recent Labs: 07/03/2020: Magnesium 2.1 07/06/2020: Hemoglobin 14.4; Platelets 259 09/08/2020: ALT 18; BUN 10; Creatinine, Ser 1.09; Potassium 4.6; Sodium 141  Recent Lipid Panel    Component Value Date/Time   CHOL 96 (L) 09/08/2020 0809   TRIG 78 09/08/2020 0809   HDL 28 (L) 09/08/2020 0809   CHOLHDL 3.4 09/08/2020 0809   CHOLHDL 6.5 07/02/2020 0757   VLDL 21 07/02/2020 0757   LDLCALC 52 09/08/2020 0809   Home Medications   Current Meds  Medication Sig   aspirin EC 81 MG EC tablet Take 1 tablet (81 mg total) by mouth daily. Swallow whole.   atorvastatin (LIPITOR) 80 MG tablet Take 1 tablet (80 mg total) by mouth daily.   clopidogrel (PLAVIX) 75 MG tablet Take 1 tablet (75 mg total) by mouth daily.   isosorbide mononitrate (IMDUR) 60 MG 24 hr tablet  Take 1 tablet (60 mg total) by mouth daily.   losartan (COZAAR) 100 MG tablet Take 1 tablet (100 mg total) by mouth daily.   metoprolol tartrate (LOPRESSOR) 25 MG tablet Take 0.5 tablets (12.5 mg total) by mouth 2 (two) times daily.   nitroGLYCERIN (NITROSTAT) 0.4 MG SL tablet Place 1 tablet (0.4 mg total) under the tongue every 5 (five) minutes as needed for chest pain.     Review of Systems      All other systems reviewed and are otherwise negative except as noted above.  Physical Exam    VS:  BP 124/84   Pulse 62   Ht 5\' 8"  (1.727 m)   Wt 171 lb 9.6 oz (77.8 kg)   SpO2 99%   BMI 26.09 kg/m  , BMI Body mass index is 26.09 kg/m.  Wt Readings from Last 3 Encounters:  09/29/20 171 lb 9.6 oz (77.8 kg)  09/09/20 170 lb 4.8 oz (77.2  kg)  07/27/20 172 lb 6.4 oz (78.2 kg)    GEN: Well nourished, well developed in no acute distress HEENT: Normal NECK: No JVD; No carotid bruits LYMPHATICS: No lymphadenopathy CARDIAC:RRR, no murmurs, rubs, gallops RESPIRATORY:  Clear to auscultation without rales, wheezing or rhonchi  ABDOMEN: Soft, non-tender, non-distended MUSCULOSKELETAL:  No edema; No deformity  SKIN: Warm and dry NEUROLOGIC:  Alert and oriented x 3 PSYCHIATRIC:  Normal affect   Assessment & Plan    CAD  -LHC 07/06/20 with first diagonal 90% stenosis but given young age and involvement of LAD recommended for medial therapy with consideration of diagonal PCI if recurrent angina despite maximal antianginal therapy.  -he denies any further anginal symptoms -continue prescription drug management with ASA, Plavix 75mg  daily, Imdur 60mg  daily and with PRN refills -change metoprolol to Toprol XL 25mg  daily  HLD, LDL goal <70, ideally <55 / Familial hyperlipidemia  - Lp(a) 180.  -Repeat lipid panel 09/08/20 total cholesterol 96, HDL 28, LDL 52, triglycerides 78.  -continue prescription drug management with Atorvastatin 80mg  daily>refilled  3.  HTN  -BP is controlled on exam  today -continue prescription drug management with BB, Losartan 100mg  daily and Imdur 60mg  daily with PRN refills  4.  Snores / sleep disordered breathing  - Reports snoring and non restorative sleep.  -Additional risk factors for OSA include HTN.  -STOP bang score of 5.  -Sleep study showed mild OSA with an AHI Of 8.6/hr and O2 sats as low as 87%. -will set up for CPAP titration   Signed, , MD 09/29/2020, 8:21 AM [facility]

## 2020-09-29 NOTE — Addendum Note (Signed)
Addended by: Theresia Majors on: 09/29/2020 08:38 AM   Modules accepted: Orders

## 2020-09-29 NOTE — Patient Instructions (Signed)
Medication Instructions:  Your physician has recommended you make the following change in your medication:  1) STOP taking Lopressor (metoprolol tartrate) 2) START taking Toprol XL (metoprolol succinate) 25 mg daily *If you need a refill on your cardiac medications before your next appointment, please call your pharmacy*   Testing/Procedures: Your provider has recommended that you have a CPAP titration. Our Sleep Team will be in contact with you to get it scheduled.   Follow-Up: At [facility], you and your health needs are our priority.  As part of our continuing mission to provide you with exceptional heart care, we have created designated Provider Care Teams.  These Care Teams include your primary Cardiologist (physician) and Advanced Practice Providers (APPs -  Physician Assistants and Nurse Practitioners) who all work together to provide you with the care you need, when you need it.  Your next appointment:   After CPAP titration  The format for your next appointment:   In Person  Provider:   Armanda Magic, MD

## 2020-10-06 ENCOUNTER — Telehealth: Payer: Self-pay | Admitting: *Deleted

## 2020-10-06 NOTE — Telephone Encounter (Signed)
-----   Message from Theresia Majors, RN sent at 09/29/2020  9:06 AM EDT ----- CPAP titration has been ordered.  Thanks!!

## 2020-10-06 NOTE — Telephone Encounter (Signed)
Prior Authorization for TITRATION sent to [employer] via web portal. Your authorization number for this request is [id number].

## 2020-10-08 NOTE — Telephone Encounter (Signed)
Prior Authorization for TITR sent to [facility] via web portal. APPROVED: VALID  11/02/10/1/22.

## 2020-10-14 NOTE — Telephone Encounter (Signed)
Good morning [name], you are at the pre-certification stage of the process. I will contact you with an update once your insurance decision comes back.   Thanks, Veda Canning, CMA Sleep Coordinator  ===View-only below this line===   ----- Message -----      From:Francisco Juarez "Donnie"      Sent:10/13/2020  8:11 PM EDT        LK:HVFMB Turner, MD   Subject:CPap  I haven't heard anything yet regarding getting the Cpap machine. Can you let me know what is going on?    Thanks

## 2020-10-20 ENCOUNTER — Encounter (HOSPITAL_BASED_OUTPATIENT_CLINIC_OR_DEPARTMENT_OTHER): Payer: 59 | Admitting: Cardiology

## 2020-11-02 NOTE — Telephone Encounter (Signed)
Patient is scheduled for CPAP Titration on 11/17/20. Patient understands his titration study will be done at [facility] sleep lab. Patient understands he will receive a letter in a week or so detailing appointment, date, time, and location. Patient understands to call if he does not receive the letter  in a timely manner.  Left detailed message on voicemail with date and time of titration and informed patient to call back to confirm or reschedule.

## 2020-11-17 ENCOUNTER — Ambulatory Visit (HOSPITAL_BASED_OUTPATIENT_CLINIC_OR_DEPARTMENT_OTHER): Payer: 59 | Attending: Cardiology | Admitting: Cardiology

## 2020-11-17 ENCOUNTER — Other Ambulatory Visit: Payer: Self-pay

## 2020-11-17 DIAGNOSIS — G4733 Obstructive sleep apnea (adult) (pediatric): Secondary | ICD-10-CM | POA: Insufficient documentation

## 2020-11-29 NOTE — Procedures (Signed)
   Patient Name: Francisco Juarez, Francisco Juarez Date: 11/17/2020 Gender: Male D.O.B: Aug 05, 1970 Age (years): 36 Referring Provider: Armanda Magic MD, ABSM Height (inches): 68 Interpreting Physician: Armanda Magic MD, ABSM Weight (lbs): 175 RPSGT: Lowry Ram BMI: 27 MRN: [id number] Neck Size: 17.00  CLINICAL INFORMATION The patient is referred for a CPAP titration to treat sleep apnea.  SLEEP STUDY TECHNIQUE As per the AASM Manual for the Scoring of Sleep and Associated Events v2.3 (April 2016) with a hypopnea requiring 4% desaturations.  The channels recorded and monitored were frontal, central and occipital EEG, electrooculogram (EOG), submentalis EMG (chin), nasal and oral airflow, thoracic and abdominal wall motion, anterior tibialis EMG, snore microphone, electrocardiogram, and pulse oximetry. Continuous positive airway pressure (CPAP) was initiated at the beginning of the study and titrated to treat sleep-disordered breathing.  MEDICATIONS Medications self-administered by patient taken the night of the study : N/A  TECHNICIAN COMMENTS Comments added by technician: None Comments added by scorer: N/A  RESPIRATORY PARAMETERS Optimal PAP Pressure (cm): 9  AHI at Optimal Pressure (/hr):2.2 Overall Minimal O2 (%):93.0  Supine % at Optimal Pressure (%): N/A Minimal O2 at Optimal Pressure (%): 93.0   SLEEP ARCHITECTURE The study was initiated at 10:20:26 PM and ended at 5:04:06 AM.  Sleep onset time was 5.7 minutes and the sleep efficiency was 92.1%. The total sleep time was 371.9 minutes.  The patient spent 6.3% of the night in stage N1 sleep, 76.0% in stage N2 sleep, 0.0% in stage N3 and 17.7% in REM.Stage REM latency was 59.5 minutes  Wake after sleep onset was 26.0. Alpha intrusion was absent. Supine sleep was 5.16%.  CARDIAC DATA The 2 lead EKG demonstrated sinus rhythm. The mean heart rate was 60.7 beats per minute. Other EKG findings include: PVCs.  LEG MOVEMENT  DATA The total Periodic Limb Movements of Sleep (PLMS) were 0. The PLMS index was 0.0. A PLMS index of <15 is considered normal in adults.  IMPRESSIONS - An optimal PAP pressure could not be selected for this patient based on the available study data. - Central sleep apnea was not noted during this titration (CAI = 1/h). - Significant oxygen desaturations were not observed during this titration (min O2 = 93.0%). - The patient snored with loud snoring volume during this titration study. - 2-lead EKG demonstrated: PVCs - Clinically significant periodic limb movements were not noted during this study. Arousals associated with PLMs were significant.  DIAGNOSIS - Obstructive Sleep Apnea (G47.33)  RECOMMENDATIONS - Recommend ResMed CPAP at 9 cm H2O with heated humidity and mask of choice.  - Avoid alcohol, sedatives and other CNS depressants that may worsen sleep apnea and disrupt normal sleep architecture. - Sleep hygiene should be reviewed to assess factors that may improve sleep quality. - Weight management and regular exercise should be initiated or continued. - Return to Sleep Center for re-evaluation after 6 weeks of therapy  [Electronically signed] 11/29/2020 10:24 AM  Armanda Magic MD, ABSM Diplomate, American Board of Sleep Medicine

## 2020-12-04 ENCOUNTER — Telehealth: Payer: Self-pay | Admitting: *Deleted

## 2020-12-04 NOTE — Telephone Encounter (Signed)
The patient has been notified of the result and verbalized understanding.  All questions (if any) were answered. Latrelle Dodrill, CMA 12/04/2020 4:57 PM    Upon patient request DME selection is [facility] Patient understands he will be contacted by [facility] to set up his cpap. Patient understands to call if [facility] does not contact him with new setup in a timely manner. Patient understands they will be called once confirmation has been received from [facility] that they have received their new machine to schedule 10 week follow up appointment.   [facility] notified of new cpap order  Please add to airview Patient was grateful for the call and thanked me

## 2020-12-04 NOTE — Telephone Encounter (Signed)
-----   Message from Quintella Reichert, MD sent at 11/29/2020 10:26 AM EST ----- Please let patient know that they had a successful PAP titration and let DME know that orders are in EPIC.  Please set up 6 week OV with me.

## 2020-12-10 ENCOUNTER — Ambulatory Visit: Payer: 59 | Admitting: Cardiology

## 2021-01-06 ENCOUNTER — Encounter: Payer: Self-pay | Admitting: Cardiology

## 2021-01-06 DIAGNOSIS — G4733 Obstructive sleep apnea (adult) (pediatric): Secondary | ICD-10-CM

## 2021-01-14 ENCOUNTER — Ambulatory Visit: Payer: Self-pay | Admitting: Cardiology

## 2021-01-31 ENCOUNTER — Encounter (HOSPITAL_BASED_OUTPATIENT_CLINIC_OR_DEPARTMENT_OTHER): Payer: Self-pay

## 2021-01-31 NOTE — Telephone Encounter (Signed)
Please advise 

## 2021-02-03 ENCOUNTER — Encounter: Payer: Self-pay | Admitting: Cardiology

## 2021-02-03 ENCOUNTER — Telehealth: Payer: Self-pay | Admitting: *Deleted

## 2021-02-03 ENCOUNTER — Ambulatory Visit: Payer: Managed Care, Other (non HMO) | Admitting: Cardiology

## 2021-02-03 ENCOUNTER — Other Ambulatory Visit: Payer: Self-pay

## 2021-02-03 VITALS — BP 134/80 | HR 73 | Ht 68.0 in | Wt 174.4 lb

## 2021-02-03 DIAGNOSIS — I10 Essential (primary) hypertension: Secondary | ICD-10-CM | POA: Diagnosis not present

## 2021-02-03 DIAGNOSIS — Z9989 Dependence on other enabling machines and devices: Secondary | ICD-10-CM

## 2021-02-03 DIAGNOSIS — G4733 Obstructive sleep apnea (adult) (pediatric): Secondary | ICD-10-CM

## 2021-02-03 NOTE — Patient Instructions (Signed)

## 2021-02-03 NOTE — Progress Notes (Signed)
Office Visit    Patient Name: Francisco Juarez Date of Encounter: 02/03/2021  PCP:  Aviva KluverPcp, No   [facility]  Cardiologist:  Armanda Magicraci [place], MD  Electrophysiologist:  None   Chief Complaint    Francisco Juarez is a [age] y.o. male with a hx of hypertension, hyperlipidemia, tobacco use, CAD s/p NSTEMI presents today for follow-up of OSA.  When I last saw him in the office he was complaining of excessive daytime sleepiness and snoring.  He underwent sleep study showing mild OSA with an AHI of 8.6/hr and O2 sats as low as 87%.  He underwent CPAP titration to 9cm H2O and is now here for followup.   He is doing well with his CPAP device and thinks that he has gotten used to it.  He tolerates the full face mask and feels the pressure is adequate.  He does have problems waking up some and will pull the mask off.  Since going on CPAP he feels rested in the am and has no significant daytime sleepiness.  He denies any significant mouth or nasal dryness or nasal congestion.  He does not think that he snores.     Past Medical History    Past Medical History:  Diagnosis Date   Chronic pain in left shoulder    Hyperlipidemia    Hypertension    Leukocytosis 11/20/2013   referred to heamtology in 2015   OSA on CPAP    mild with AHI 8.6/hr now on CPAP   Polyarthralgia    referred to rheum in 20a5   Past Surgical History:  Procedure Laterality Date   ANTERIOR CRUCIATE LIGAMENT REPAIR     left knee   APPENDECTOMY     FOOT SURGERY     screw right foot 5th metatarsal   INTRAVASCULAR PRESSURE WIRE/FFR STUDY N/A 07/06/2020   Procedure: INTRAVASCULAR PRESSURE WIRE/FFR STUDY;  Surgeon: Lyn RecordsSmith, Henry W, MD;  Location: [facility] INVASIVE CV LAB;  Service: Cardiovascular;  Laterality: N/A;   KNEE SURGERY     left knee ligaments repair   LEFT HEART CATH AND CORONARY ANGIOGRAPHY N/A 07/06/2020   Procedure: LEFT HEART CATH AND CORONARY ANGIOGRAPHY;  Surgeon: Lyn RecordsSmith, Henry W, MD;   Location: [facility] INVASIVE CV LAB;  Service: Cardiovascular;  Laterality: N/A;    Allergies  No Known Allergies  History of Present Illness    Francisco Juarez is a [age] y.o. male with a hx of hypertension, hyperlipidemia, tobacco use, CAD s/p NSTEMI.  He presented to the hospital 07/02/20 and was diagnosed with NSTEMI.  Echocardiogram 07/02/2020 LVEF 60 to 65%, no RWMA, normal diastolic parameters, trivial MR.  Underwent cardiac catheterization 07/06/2020 showing 50-80% stenosis in mid to distal nondominant RCA, 50% proximal to mid ramus, 30 to 70% mid LAD percent, 90% ostial diagonal #1, 70% LAD with RFR 0.93.  The first diagonal was not treated as it could potentially involve LAD.  Given young age he was recommended for medical management.  LVEDP 19 mmHg consistent with diastolic heart failure presumed due to poorly controlled hypertension.  He was recommended for aggressive antianginal therapy and if angina remains limiting symptom after optimization and consideration of diagonal PCI would be appropriate.  Seen 07/27/20. Noted a few episodes of chest discomfort and Imdur initiated. He had lipoprotein a collected as part of a research study which resulted at 180.   Repeat lipid panel 09/08/20 total cholesterol 96, HDL 28, LDL 52, triglycerides 78.  He is here today for followup and is doing well.  He denies any chest pain or pressure, SOB, DOE, PND, orthopnea, LE edema, dizziness, palpitations or syncope. He is compliant with his meds and is tolerating meds with no SE.   He says that his only problem is feeling tired in the am when he gets up and exhausted in the evenings. He recently underwent HST which showed mild OSA with an AHI of 8/hr.    EKGs/Labs/Other Studies Reviewed:   The following studies were reviewed today:  Echo 07/02/2020  1. Left ventricular ejection fraction, by estimation, is 60 to 65%. The  left ventricle has normal function. The left ventricle has no regional  wall motion  abnormalities. Left ventricular diastolic parameters were  normal.   2. Right ventricular systolic function is normal. The right ventricular  size is normal. Tricuspid regurgitation signal is inadequate for assessing  PA pressure.   3. The mitral valve is normal in structure. Trivial mitral valve  regurgitation. No evidence of mitral stenosis.   4. The aortic valve is normal in structure. Aortic valve regurgitation is  not visualized. No aortic stenosis is present.   5. The inferior vena cava is normal in size with greater than 50%  respiratory variability, suggesting right atrial pressure of 3 mmHg.   LHC 07/06/2020 Conclusion  Diffuse 60 to 80% stenosis in the mid to distal nondominant RCA, second obtuse marginal, 50% proximal to mid ramus, 30 to 70% mid LAD, and 90% ostial diagonal #1 (relatively large vessel and possibly the culprit). 70% mid LAD RFR 0.93 (abnormal less than 0.89) First diagonal was not treated as it could potentially involve the LAD.  The best approach given this patient's young age is aggressive medical management of angina. Normal LV systolic function with EF estimated to be 50 to 55%.  LVEDP 19 mmHg, consistent with diastolic heart failure likely related to Poorly controlled blood pressure.   RECOMMENDATIONS:   Aggressive antianginal therapy.  Once optimized, if angina remains a limiting symptom, consideration of diagonal PCI would then be appropriate. Aggressive preventive therapy: Blood pressure control; smoking cessation; LDL target less than 55. Started Plavix in addition to aspirin which should be continued for at least 6 months. Wean and DC nitroglycerin today.   Coronary Diagrams   Diagnostic Dominance: Left    EKG: EKG was not ordered today.   Recent Labs: 07/03/2020: Magnesium 2.1 07/06/2020: Hemoglobin 14.4; Platelets 259 09/08/2020: ALT 18; BUN 10; Creatinine, Ser 1.09; Potassium 4.6; Sodium 141  Recent Lipid Panel    Component Value Date/Time    CHOL 96 (L) 09/08/2020 0809   TRIG 78 09/08/2020 0809   HDL 28 (L) 09/08/2020 0809   CHOLHDL 3.4 09/08/2020 0809   CHOLHDL 6.5 07/02/2020 0757   VLDL 21 07/02/2020 0757   LDLCALC 52 09/08/2020 0809   Home Medications   Current Meds  Medication Sig   aspirin EC 81 MG EC tablet Take 1 tablet (81 mg total) by mouth daily. Swallow whole.   atorvastatin (LIPITOR) 80 MG tablet Take 1 tablet (80 mg total) by mouth daily.   clopidogrel (PLAVIX) 75 MG tablet Take 1 tablet (75 mg total) by mouth daily.   isosorbide mononitrate (IMDUR) 60 MG 24 hr tablet Take 1 tablet (60 mg total) by mouth daily.   losartan (COZAAR) 100 MG tablet Take 1 tablet (100 mg total) by mouth daily.   metoprolol succinate (TOPROL XL) 25 MG 24 hr tablet Take 1 tablet (25 mg total)  by mouth daily.   nitroGLYCERIN (NITROSTAT) 0.4 MG SL tablet Place 1 tablet (0.4 mg total) under the tongue every 5 (five) minutes as needed for chest pain.     Review of Systems      All other systems reviewed and are otherwise negative except as noted above.  Physical Exam    VS:  BP 134/80    Pulse 73    Ht 5\' 8"  (1.727 m)    Wt 174 lb 6.4 oz (79.1 kg)    SpO2 96%    BMI 26.52 kg/m  , BMI Body mass index is 26.52 kg/m.  Wt Readings from Last 3 Encounters:  02/03/21 174 lb 6.4 oz (79.1 kg)  11/17/20 175 lb (79.4 kg)  09/29/20 171 lb 9.6 oz (77.8 kg)    GEN: Well nourished, well developed in no acute distress HEENT: Normal NECK: No JVD; No carotid bruits LYMPHATICS: No lymphadenopathy CARDIAC:RRR, no murmurs, rubs, gallops RESPIRATORY:  Clear to auscultation without rales, wheezing or rhonchi  ABDOMEN: Soft, non-tender, non-distended MUSCULOSKELETAL:  No edema; No deformity  SKIN: Warm and dry NEUROLOGIC:  Alert and oriented x 3 PSYCHIATRIC:  Normal affect   Assessment & Plan     OSA - The patient is tolerating PAP therapy well without any problems. The PAP download performed by his DME was personally reviewed and  interpreted by me today and showed an AHI of 0.9/hr on 9 cm H2O with 22% compliance in using more than 4 hours nightly.  The patient has been using and benefiting from PAP use and will continue to benefit from therapy.  -I encouraged him to be more compliant with his device  2.  HTN  -BP is controlled on exam today -continue prescription drug management with BB, Losartan 100mg  daily and Imdur 60mg  daily with PRN refills  Followup with me in 1 year    Signed, 10/01/20, MD 02/03/2021, 10:21 AM [facility]

## 2021-02-03 NOTE — Telephone Encounter (Signed)
Gave patient recommendation made by Dr. Turner and  understanding was verbalized. 

## 2021-02-03 NOTE — Telephone Encounter (Signed)
AHi good - please call patient and let him know he needs to use his device at least 4-5 hours every night

## 2021-02-23 ENCOUNTER — Ambulatory Visit (HOSPITAL_BASED_OUTPATIENT_CLINIC_OR_DEPARTMENT_OTHER): Payer: Self-pay | Admitting: Family

## 2021-04-25 ENCOUNTER — Ambulatory Visit: Payer: Self-pay | Admitting: Cardiology

## 2021-05-23 ENCOUNTER — Encounter: Payer: Self-pay | Admitting: Cardiology

## 2021-05-23 DIAGNOSIS — I10 Essential (primary) hypertension: Secondary | ICD-10-CM

## 2021-05-25 ENCOUNTER — Other Ambulatory Visit: Payer: Self-pay | Admitting: Cardiology

## 2021-05-25 DIAGNOSIS — R030 Elevated blood-pressure reading, without diagnosis of hypertension: Secondary | ICD-10-CM

## 2021-05-25 DIAGNOSIS — I10 Essential (primary) hypertension: Secondary | ICD-10-CM

## 2021-05-31 ENCOUNTER — Ambulatory Visit (INDEPENDENT_AMBULATORY_CARE_PROVIDER_SITE_OTHER): Payer: Commercial Managed Care - HMO

## 2021-05-31 DIAGNOSIS — I10 Essential (primary) hypertension: Secondary | ICD-10-CM | POA: Diagnosis not present

## 2021-05-31 DIAGNOSIS — R030 Elevated blood-pressure reading, without diagnosis of hypertension: Secondary | ICD-10-CM | POA: Diagnosis not present

## 2021-05-31 NOTE — Progress Notes (Unsigned)
24 hour ambulatory blood pressure monitor applied to patients left arm using standard adult cuff. 

## 2021-06-06 ENCOUNTER — Telehealth: Payer: Self-pay | Admitting: *Deleted

## 2021-06-06 MED ORDER — METOPROLOL SUCCINATE ER 25 MG PO TB24: 25.0000 mg | ORAL_TABLET | Freq: Two times a day (BID) | ORAL | 3 refills | Status: DC

## 2021-06-06 NOTE — Telephone Encounter (Addendum)
Prescription has been sent to [facility] twice but transmission failed both times.  I called pharmacy twice but calls became  disconnected.  I checked with patient and he does not use any other pharmacies.  Will continue to try to contact pharmacy.  Patient would like prescription mailed to him if unable to send to pharmacy

## 2021-06-06 NOTE — Telephone Encounter (Signed)
Tried again to reach pharmacy but received message that pharmacy is temporarily closed

## 2021-06-06 NOTE — Telephone Encounter (Signed)
Patient notified.  New prescription sent to [facility] on [address].  He will send in readings through my chart.

## 2021-06-06 NOTE — Telephone Encounter (Signed)
-----   Message from Sueanne Margarita, MD sent at 06/06/2021  1:55 PM EDT ----- Diastolic blood pressure running high.  Please increase Toprol-XL to 25 mg twice daily.  Please have him check his blood pressure twice daily for a week and call me with results

## 2021-06-06 NOTE — Addendum Note (Signed)
Addended by: Dossie Arbour on: 06/06/2021 02:20 PM   Modules accepted: Orders

## 2021-06-07 MED ORDER — METOPROLOL SUCCINATE ER 25 MG PO TB24: 25.0000 mg | ORAL_TABLET | Freq: Two times a day (BID) | ORAL | 3 refills | Status: DC

## 2021-06-07 NOTE — Telephone Encounter (Signed)
Prescription sent to [facility].    E-Prescribing Status: Receipt confirmed by pharmacy (06/07/2021 10:41 AM EDT)

## 2021-06-07 NOTE — Addendum Note (Signed)
Addended by: Dossie Arbour on: 06/07/2021 10:44 AM   Modules accepted: Orders

## 2021-07-02 ENCOUNTER — Other Ambulatory Visit: Payer: Self-pay

## 2021-07-02 ENCOUNTER — Emergency Department (HOSPITAL_BASED_OUTPATIENT_CLINIC_OR_DEPARTMENT_OTHER)
Admission: EM | Admit: 2021-07-02 | Discharge: 2021-07-02 | Disposition: A | Discharge status: Home / Self Care | Payer: Commercial Managed Care - HMO | Attending: Emergency Medicine | Admitting: Emergency Medicine

## 2021-07-02 ENCOUNTER — Encounter (HOSPITAL_BASED_OUTPATIENT_CLINIC_OR_DEPARTMENT_OTHER): Payer: Self-pay

## 2021-07-02 DIAGNOSIS — H5711 Ocular pain, right eye: Secondary | ICD-10-CM | POA: Insufficient documentation

## 2021-07-02 DIAGNOSIS — S0501XA Injury of conjunctiva and corneal abrasion without foreign body, right eye, initial encounter: Secondary | ICD-10-CM

## 2021-07-02 MED ORDER — FLUORESCEIN SODIUM 1 MG OP STRP
1.0000 | ORAL_STRIP | Freq: Once | OPHTHALMIC | Status: AC
  Administered 2021-07-02: 1 via OPHTHALMIC
  Filled 2021-07-02: qty 1

## 2021-07-02 MED ORDER — TETRACAINE HCL 0.5 % OP SOLN
2.0000 [drp] | Freq: Once | OPHTHALMIC | Status: AC
  Administered 2021-07-02: 2 [drp] via OPHTHALMIC
  Filled 2021-07-02: qty 4

## 2021-07-02 MED ORDER — ERYTHROMYCIN 5 MG/GM OP OINT
TOPICAL_OINTMENT | Freq: Once | OPHTHALMIC | Status: AC
  Administered 2021-07-02: 1 via OPHTHALMIC
  Filled 2021-07-02: qty 3.5

## 2021-07-02 NOTE — ED Notes (Signed)
ED Provider at bedside. 

## 2021-07-02 NOTE — Discharge Instructions (Signed)
Use the erythromycin ointment, a 1/2 inch ribbon 3 times daily for the next several days.  Return to the emergency department for worsening pain, worsening swelling, loss of visual acuity, or for other new and concerning symptoms.

## 2021-07-02 NOTE — ED Provider Notes (Signed)
[facility] EMERGENCY DEPT Provider Note   CSN: [id number] Arrival date & time: 07/02/21  0009     History  Chief Complaint  Patient presents with   Foreign Body in [facility] Francisco Juarez is a 51 y.o. male.  Patient is a 51 year old male presenting with complaints of right eye pain.  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  He has had foreign body sensation and irritation since.  He describes some blurry vision.  There are no alleviating factors.  Pain is worse with bright lights.  The history is provided by the patient.       Home Medications Prior to Admission medications   Medication Sig Start Date End Date Taking? Authorizing Provider  aspirin EC 81 MG EC tablet Take 1 tablet (81 mg total) by mouth daily. Swallow whole. 07/07/20   Kathlen Mody, MD  atorvastatin (LIPITOR) 80 MG tablet Take 1 tablet (80 mg total) by mouth daily. 09/29/20   Quintella Reichert, MD  clopidogrel (PLAVIX) 75 MG tablet Take 1 tablet (75 mg total) by mouth daily. 07/27/20   Alver Sorrow, NP  isosorbide mononitrate (IMDUR) 60 MG 24 hr tablet Take 1 tablet (60 mg total) by mouth daily. 09/09/20 09/04/21  Alver Sorrow, NP  losartan (COZAAR) 100 MG tablet Take 1 tablet (100 mg total) by mouth daily. 09/29/20 09/24/21  Quintella Reichert, MD  metoprolol succinate (TOPROL XL) 25 MG 24 hr tablet Take 1 tablet (25 mg total) by mouth 2 (two) times daily. 06/07/21   Quintella Reichert, MD  nitroGLYCERIN (NITROSTAT) 0.4 MG SL tablet Place 1 tablet (0.4 mg total) under the tongue every 5 (five) minutes as needed for chest pain. 07/06/20   Kathlen Mody, MD      Allergies    Patient has no known allergies.    Review of Systems   Review of Systems  All other systems reviewed and are negative.   Physical Exam Updated Vital Signs BP (!) 168/111   Pulse 64   Temp 98.2 F (36.8 C) (Oral)   Resp 17   Ht 5\' 8"  (1.727 m)   Wt 79.4 kg   SpO2 99%   BMI  26.61 kg/m  Physical Exam Vitals and nursing note reviewed.  Constitutional:      Appearance: Normal appearance.  HENT:     Head: Normocephalic and atraumatic.  Eyes:     Comments: To the right eye, there is conjunctival injection.  I am unable to identify an obvious foreign body within the cornea or sclera.  I have also everted the lids to the best of my ability and see no foreign body.  Anterior chamber is clear.  There is a small area of fluorescein uptake at 11:00.  Pulmonary:     Effort: Pulmonary effort is normal.  Neurological:     Mental Status: He is alert and oriented to person, place, and time.     ED Results / Procedures / Treatments   Labs (all labs ordered are listed, but only abnormal results are displayed) Labs Reviewed - No data to display  EKG None  Radiology No results found.  Procedures Procedures    Medications Ordered in ED Medications  erythromycin ophthalmic ointment (has no administration in time range)  tetracaine (PONTOCAINE) 0.5 % ophthalmic solution 2 drop (2 drops Right Eye Given 07/02/21 0114)  fluorescein ophthalmic strip 1 strip (1 strip Right  Eye Given 07/02/21 0114)    ED Course/ Medical Decision Making/ A&P  I see no obvious foreign body underneath the eyelids, within the cornea, or within the sclera.  There is a small area of fluorescein uptake at 11:00 that I suspect is a corneal abrasion.  I believe this is the cause of his discomfort.  Patient to be treated with Ilotycin and follow-up as needed.  Final Clinical Impression(s) / ED Diagnoses Final diagnoses:  None    Rx / DC Orders ED Discharge Orders     None         Geoffery Lyons, MD 07/02/21 0126

## 2021-07-02 NOTE — ED Notes (Addendum)
Pt awake and alert sitting up in bed with visitor at bedside; erythema to R eye with mild clear drainage and light sensitivity reported.  Pt reporting pain level now 2/10.  Pt agreeable with d/c plan as discussed by provider- this nurse has verbally reinforced d/c instructions and provided pt with written copy.   Pt acknowledges verbal understanding and denies any additional questions, concerns, needs-  pt ambulatory at discharge; steady gait; vitals within baseline (bp remains elevated - pt with h/o HTN - no complaint of cp/sob/HA/dizziness verbalized).

## 2021-07-02 NOTE — ED Triage Notes (Signed)
POV, pt sts that he was at work today when he thinks he got sawdust in his right eye, pt c/o blurred vision, pain in right eye. Eye is red, swollen and irritated at triage. A&O x 4, ambulatory.

## 2021-07-06 ENCOUNTER — Telehealth (HOSPITAL_BASED_OUTPATIENT_CLINIC_OR_DEPARTMENT_OTHER): Payer: Self-pay

## 2021-07-06 DIAGNOSIS — I25118 Atherosclerotic heart disease of native coronary artery with other forms of angina pectoris: Secondary | ICD-10-CM

## 2021-07-06 DIAGNOSIS — I10 Essential (primary) hypertension: Secondary | ICD-10-CM

## 2021-07-06 MED ORDER — ISOSORBIDE MONONITRATE ER 60 MG PO TB24: 60.0000 mg | ORAL_TABLET | Freq: Every day | ORAL | 3 refills | Status: DC

## 2021-07-06 MED ORDER — CLOPIDOGREL BISULFATE 75 MG PO TABS: 75.0000 mg | ORAL_TABLET | Freq: Every day | ORAL | 3 refills | Status: DC

## 2021-07-06 NOTE — Telephone Encounter (Signed)
Received fax from [facility]) requesting refills for Isosorbide and Clopidogrel.   Pt of Dr. Mayford Knife. Please review for refill. Thank you!

## 2021-07-06 NOTE — Addendum Note (Signed)
Addended by: Margaret Pyle D on: 07/06/2021 02:38 PM   Modules accepted: Orders

## 2021-07-06 NOTE — Telephone Encounter (Signed)
Pt's medications were sen to pt's pharmacy as requested. Confirmation received.

## 2021-07-11 ENCOUNTER — Encounter: Payer: Self-pay | Admitting: Cardiology

## 2021-07-12 MED ORDER — NITROGLYCERIN 0.4 MG SL SUBL: 0.4000 mg | SUBLINGUAL_TABLET | SUBLINGUAL | 12 refills | Status: DC | PRN

## 2021-07-27 IMAGING — DX DG CHEST 1V PORT
1 series · 1 of 1 positions shown · non-contrast
Comparison: 05/30/2007

CLINICAL DATA: Chest pain

EXAM:
PORTABLE CHEST 1 VIEW

[chest]
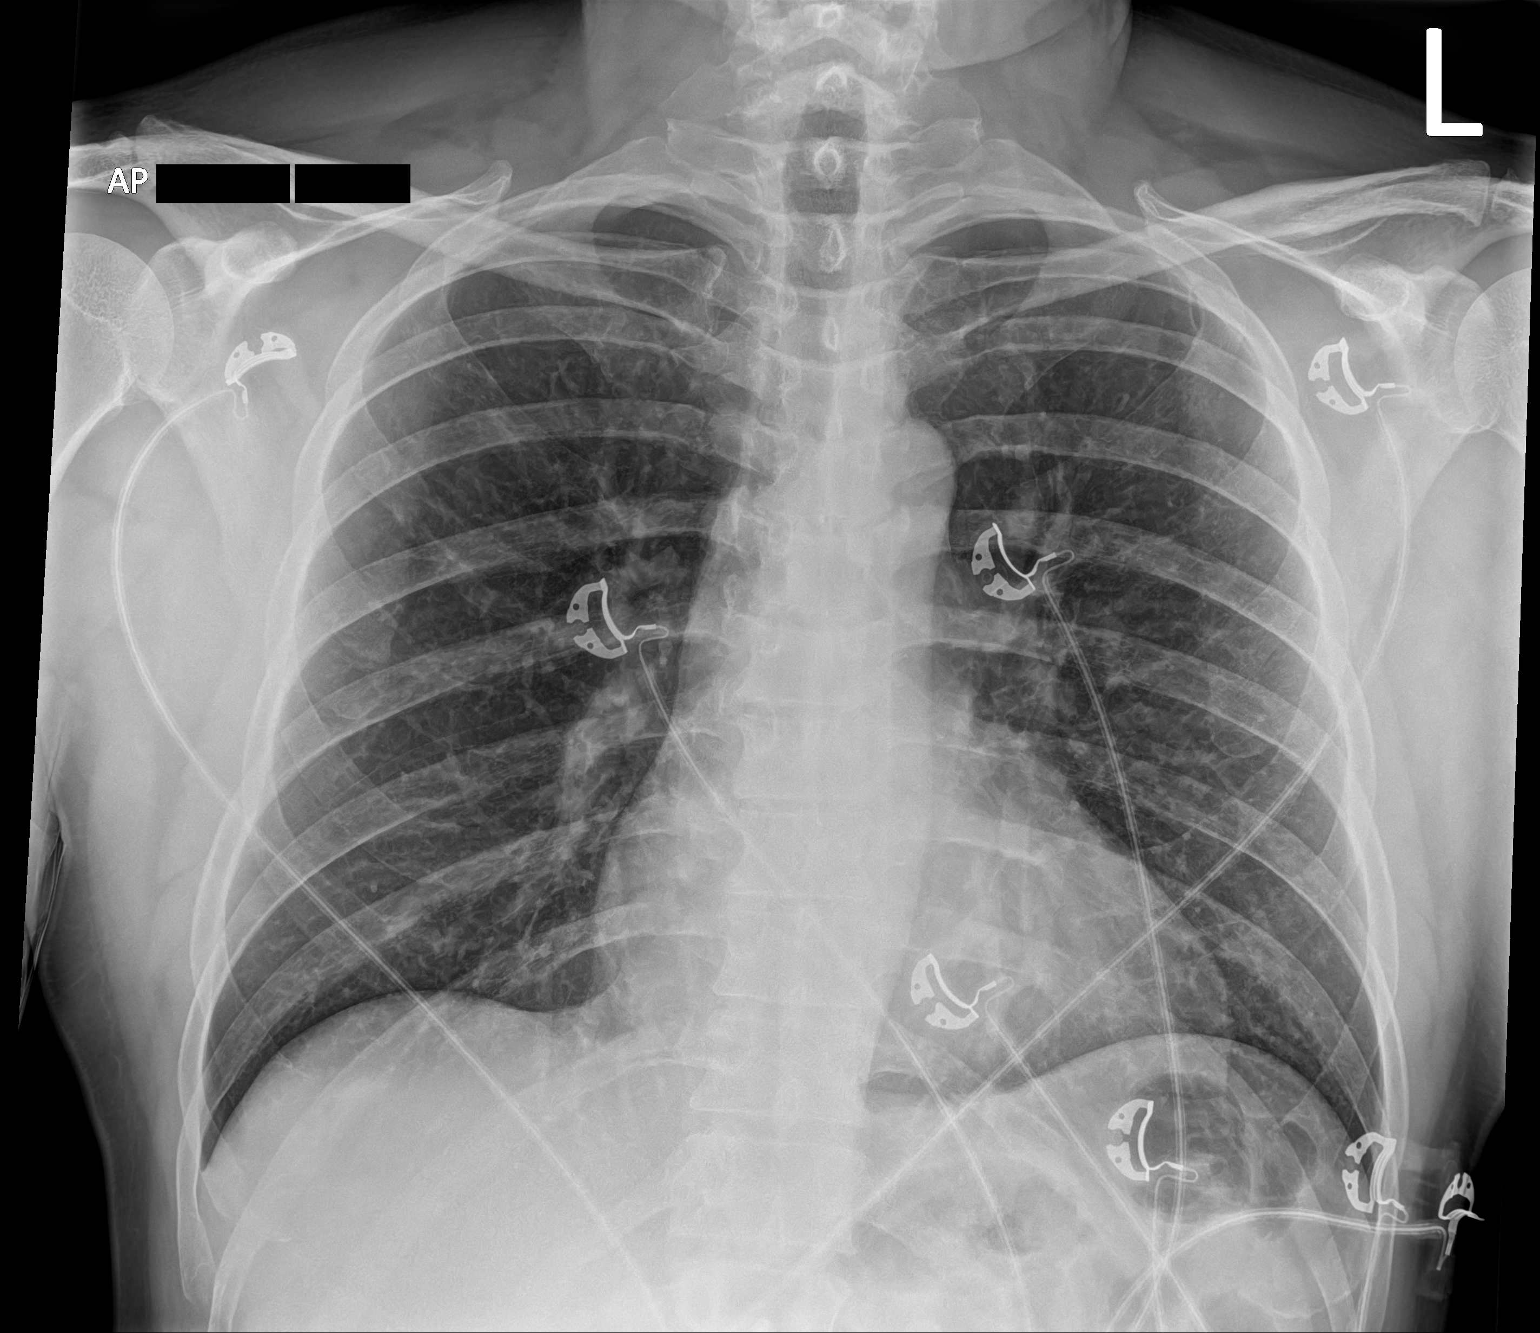

[1 of 1 positions shown; findings below may reference images not displayed]

FINDINGS: The heart size and mediastinal contours are within normal limits.
Both lungs are clear. The visualized skeletal structures are
unremarkable.
IMPRESSION: No active disease.

## 2021-08-17 ENCOUNTER — Encounter: Payer: Self-pay | Admitting: Cardiology

## 2021-09-29 ENCOUNTER — Encounter (HOSPITAL_BASED_OUTPATIENT_CLINIC_OR_DEPARTMENT_OTHER): Payer: Self-pay | Admitting: Cardiology

## 2021-09-29 MED ORDER — ATORVASTATIN CALCIUM 80 MG PO TABS: 80.0000 mg | ORAL_TABLET | Freq: Every day | ORAL | 4 refills | Status: DC

## 2021-10-17 ENCOUNTER — Other Ambulatory Visit: Payer: Self-pay

## 2021-10-17 MED ORDER — ATORVASTATIN CALCIUM 80 MG PO TABS: 80.0000 mg | ORAL_TABLET | Freq: Every day | ORAL | 4 refills | Status: DC

## 2021-12-27 ENCOUNTER — Other Ambulatory Visit: Payer: Self-pay

## 2021-12-27 DIAGNOSIS — I10 Essential (primary) hypertension: Secondary | ICD-10-CM

## 2021-12-27 DIAGNOSIS — I25118 Atherosclerotic heart disease of native coronary artery with other forms of angina pectoris: Secondary | ICD-10-CM

## 2021-12-27 MED ORDER — LOSARTAN POTASSIUM 100 MG PO TABS: 100.0000 mg | ORAL_TABLET | Freq: Every day | ORAL | 0 refills | Status: DC

## 2021-12-27 MED ORDER — METOPROLOL SUCCINATE ER 25 MG PO TB24: 25.0000 mg | ORAL_TABLET | Freq: Two times a day (BID) | ORAL | 0 refills | Status: DC

## 2022-01-23 ENCOUNTER — Ambulatory Visit (HOSPITAL_BASED_OUTPATIENT_CLINIC_OR_DEPARTMENT_OTHER): Payer: Commercial Managed Care - HMO | Admitting: Family

## 2022-01-26 NOTE — Progress Notes (Signed)
Office Visit    Patient Name: Francisco Juarez Date of Encounter: 01/27/2022  PCP:  Kathyrn Lass   [place]  Cardiologist:  Fransico Him, MD  Advanced Practice Provider:  No care team member to display Electrophysiologist:  None   Chief Complaint    Francisco Juarez is a 52 y.o. male presents today for follow-up of CAD and HLD  Past Medical History    Past Medical History:  Diagnosis Date   Chronic pain in left shoulder    Hyperlipidemia    Hypertension    Leukocytosis 11/20/2013   referred to heamtology in 2015   OSA on CPAP    mild with AHI 8.6/hr now on CPAP   Polyarthralgia    referred to rheum in 20a5   Past Surgical History:  Procedure Laterality Date   ANTERIOR CRUCIATE LIGAMENT REPAIR     left knee   APPENDECTOMY     FOOT SURGERY     screw right foot 5th metatarsal   INTRAVASCULAR PRESSURE WIRE/FFR STUDY N/A 07/06/2020   Procedure: INTRAVASCULAR PRESSURE WIRE/FFR STUDY;  Surgeon: Belva Crome, MD;  Location: [facility];  Service: Cardiovascular;  Laterality: N/A;   KNEE SURGERY     left knee ligaments repair   LEFT HEART CATH AND CORONARY ANGIOGRAPHY N/A 07/06/2020   Procedure: LEFT HEART CATH AND CORONARY ANGIOGRAPHY;  Surgeon: Belva Crome, MD;  Location: [facility];  Service: Cardiovascular;  Laterality: N/A;    Allergies  No Known Allergies  History of Present Illness    Francisco Juarez is a 52 y.o. male with a hx of hypertension, hyperlipidemia,OSA, tobacco use, CAD s/p NSTEMI last seen 02/03/21  He presented to the hospital 07/02/20 and was diagnosed with NSTEMI.  Echocardiogram 07/02/2020 LVEF 60 to 65%, no R WMA, normal diastolic parameters, trivial MR.  Underwent cardiac catheterization 07/06/2020 showing 50-80% stenosis in mid to distal nondominant RCA, second twos marginal, 50% proximal to mid ramus, 30 to 70% mid LAD percent, 90% ostial diagonal #1, 70% LAD with RFR 0.93.  The first  diagonal was not treated as it could potentially involve LAD.  Given young age he was recommended for medical management.  LVEDP 19 mmHg consistent with diastolic heart failure presumed due to poorly controlled hypertension.  He was recommended for aggressive antianginal therapy and if angina remains limiting symptom after optimization and consideration of diagonal PCI would be appropriate. 07/2020 lipoprotein a collected as part of a research study which resulted at 180. He had sleep study which revealed OSA and CPAP initiated.   Last saw Dr. Radford Pax 02/03/21 doing well from cardiac perspective and no changes made.   Presents today for follow up. 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 Enjoys travelling with his family and watching his granddaughter play travel volleyball. Past couple days has had dizzy spells when getting up from a chair. No near syncope, fatigue. Denies fatigue, SOB, headaches, chest pain/pressure, swelling in the lower extremities, nor palpitations. Sleeping well but has not been using CPAP as it was too expensive. Diet consist of fast foods due to job. Does smoke 1 PPD cigarettes.  EKGs/Labs/Other Studies Reviewed:   The following studies were reviewed today:  Echo 07/02/2020  1. Left ventricular ejection fraction, by estimation, is 60 to 65%. The  left ventricle has normal function. The left ventricle has no regional  wall motion abnormalities. Left ventricular diastolic parameters were  normal.  2. Right ventricular systolic function is normal. The right ventricular  size is normal. Tricuspid regurgitation signal is inadequate for assessing  PA pressure.   3. The mitral valve is normal in structure. Trivial mitral valve  regurgitation. No evidence of mitral stenosis.   4. The aortic valve is normal in structure. Aortic valve regurgitation is  not visualized. No aortic stenosis is present.   5. The inferior vena cava is normal in size with greater than 50%  respiratory  variability, suggesting right atrial pressure of 3 mmHg.   LHC 07/06/2020 Conclusion  Diffuse 60 to 80% stenosis in the mid to distal nondominant RCA, second obtuse marginal, 50% proximal to mid ramus, 30 to 70% mid LAD, and 90% ostial diagonal #1 (relatively large vessel and possibly the culprit). 70% mid LAD RFR 0.93 (abnormal less than 0.89) First diagonal was not treated as it could potentially involve the LAD.  The best approach given this patient's young age is aggressive medical management of angina. Normal LV systolic function with EF estimated to be 50 to 55%.  LVEDP 19 mmHg, consistent with diastolic heart failure likely related to Poorly controlled blood pressure.   RECOMMENDATIONS:   Aggressive antianginal therapy.  Once optimized, if angina remains a limiting symptom, consideration of diagonal PCI would then be appropriate. Aggressive preventive therapy: Blood pressure control; smoking cessation; LDL target less than 55. Started Plavix in addition to aspirin which should be continued for at least 6 months. Wean and DC nitroglycerin today.   Coronary Diagrams   Diagnostic Dominance: Left      EKG: EKG ordered today. EKG performed today demonstrates SB 54 bpm with no acute ST/T wave changes.   Recent Labs: No results found for requested labs within last 365 days.  Recent Lipid Panel    Component Value Date/Time   CHOL 96 (L) 09/08/2020 0809   TRIG 78 09/08/2020 0809   HDL 28 (L) 09/08/2020 0809   CHOLHDL 3.4 09/08/2020 0809   CHOLHDL 6.5 07/02/2020 0757   VLDL 21 07/02/2020 0757   LDLCALC 52 09/08/2020 0809   Home Medications   Current Meds  Medication Sig   aspirin EC 81 MG EC tablet Take 1 tablet (81 mg total) by mouth daily. Swallow whole.   [DISCONTINUED] atorvastatin (LIPITOR) 80 MG tablet Take 1 tablet (80 mg total) by mouth daily.   [DISCONTINUED] clopidogrel (PLAVIX) 75 MG tablet Take 1 tablet (75 mg total) by mouth daily.   [DISCONTINUED] isosorbide  mononitrate (IMDUR) 60 MG 24 hr tablet Take 1 tablet (60 mg total) by mouth daily.   [DISCONTINUED] losartan (COZAAR) 100 MG tablet Take 1 tablet (100 mg total) by mouth daily.   [DISCONTINUED] metoprolol succinate (TOPROL XL) 25 MG 24 hr tablet Take 1 tablet (25 mg total) by mouth 2 (two) times daily.   [DISCONTINUED] nitroGLYCERIN (NITROSTAT) 0.4 MG SL tablet Place 1 tablet (0.4 mg total) under the tongue every 5 (five) minutes as needed for chest pain.     Review of Systems      All other systems reviewed and are otherwise negative except as noted above.  Physical Exam    VS:  BP 112/70   Pulse (!) 54   Ht 5\' 8"  (1.727 m)   Wt 180 lb (81.6 kg)   BMI 27.37 kg/m  , BMI Body mass index is 27.37 kg/m.  Wt Readings from Last 3 Encounters:  01/27/22 180 lb (81.6 kg)  02/03/21 174 lb 6.4 oz (79.1 kg)  11/17/20 175  lb (79.4 kg)    GEN: Well nourished, well developed, in no acute distress. HEENT: normal. Neck: Supple, no JVD, carotid bruits, or masses. Cardiac: RRR, no murmurs, rubs, or gallops. No clubbing, cyanosis, edema. Radials/PT 2+ and equal bilaterally.  Respiratory:  Respirations regular and unlabored, clear to auscultation bilaterally. GI: Soft, nontender, nondistended. MS: No deformity or atrophy. Skin: Warm and dry, no rash. Neuro:  Strength and sensation are intact. Psych: Normal affect.  Assessment & Plan    CAD -  LHC 07/06/20 with first diagonal 90% stenosis but given young age and involvement of LAD recommended for medial therapy with consideration of diagonal PCI if recurrent angina despite maximal antianginal therapy. GDMT includes Aspirin, Metoprolol, Plavix and Imdur. Heart healthy diet and regular cardiovascular exercise encouraged.    HLD, LDL goal <70, ideally <55 / Familial hyperlipidemia - Lp(a) 180. Ordered lipid panel, CMP today. If LDL not at goal consider Nexlizet. Continue atorvastatin 80 mg  Lightheadedness - c/w orthostatic hypotension. Discussed  reducing antihypertensive regimen. Prefers to maintain same medications and monitor. Will send MyChart message in 2 weeks to check in. Recommended to reduce caffeine intake. Update CMP, CbC.   HTN - BP well controlled. Continue current antihypertensive regimen. Management of intermittent lightheadedness, as above. Continue on metoprolol, Imdur, losartan.   OSA- Notes CPAP was cost prohibitive as was costing him $500 per month. Feels he is sleeping well and waking well rested. Will route to Dr. Radford Pax so she is aware.   Right ear itching - Notes previous difficulties with ear wax. Encouraged to trial OTC Debrox and establish with PCP.  Disposition: Follow up in 1 year  with Dr. Radford Pax or APP.  Signed, Loel Dubonnet, NP 01/27/2022, 10:01 AM [place]

## 2022-01-27 ENCOUNTER — Encounter (HOSPITAL_BASED_OUTPATIENT_CLINIC_OR_DEPARTMENT_OTHER): Payer: Self-pay | Admitting: Family

## 2022-01-27 ENCOUNTER — Ambulatory Visit (HOSPITAL_BASED_OUTPATIENT_CLINIC_OR_DEPARTMENT_OTHER): Payer: Commercial Managed Care - HMO | Admitting: Family

## 2022-01-27 VITALS — BP 112/70 | HR 54 | Ht 68.0 in | Wt 180.0 lb

## 2022-01-27 DIAGNOSIS — I10 Essential (primary) hypertension: Secondary | ICD-10-CM

## 2022-01-27 DIAGNOSIS — E785 Hyperlipidemia, unspecified: Secondary | ICD-10-CM

## 2022-01-27 DIAGNOSIS — I25118 Atherosclerotic heart disease of native coronary artery with other forms of angina pectoris: Secondary | ICD-10-CM

## 2022-01-27 DIAGNOSIS — G4733 Obstructive sleep apnea (adult) (pediatric): Secondary | ICD-10-CM | POA: Diagnosis not present

## 2022-01-27 LAB — CBC
Hematocrit: 46.3 % (ref 37.5–51.0)
Hemoglobin: 15.4 g/dL (ref 13.0–17.7)
MCH: 29.6 pg (ref 26.6–33.0)
MCHC: 33.3 g/dL (ref 31.5–35.7)
MCV: 89 fL (ref 79–97)
Platelets: 273 10*3/uL (ref 150–450)
RBC: 5.2 x10E6/uL (ref 4.14–5.80)
RDW: 12.2 % (ref 11.6–15.4)
WBC: 9.4 10*3/uL (ref 3.4–10.8)

## 2022-01-27 LAB — COMPREHENSIVE METABOLIC PANEL
ALT: 38 IU/L (ref 0–44)
AST: 29 IU/L (ref 0–40)
Albumin/Globulin Ratio: 1.8 (ref 1.2–2.2)
Albumin: 4.4 g/dL (ref 4.1–5.1)
Alkaline Phosphatase: 132 IU/L — ABNORMAL HIGH (ref 44–121)
BUN/Creatinine Ratio: 10 (ref 9–20)
BUN: 11 mg/dL (ref 6–24)
Bilirubin Total: 0.4 mg/dL (ref 0.0–1.2)
CO2: 22 mmol/L (ref 20–29)
Calcium: 9.7 mg/dL (ref 8.7–10.2)
Chloride: 106 mmol/L (ref 96–106)
Creatinine, Ser: 1.09 mg/dL (ref 0.76–1.27)
Globulin, Total: 2.5 g/dL (ref 1.5–4.5)
Glucose: 96 mg/dL (ref 70–99)
Potassium: 4.6 mmol/L (ref 3.5–5.2)
Sodium: 141 mmol/L (ref 134–144)
Total Protein: 6.9 g/dL (ref 6.0–8.5)
eGFR: 83 mL/min/{1.73_m2} (ref >59)

## 2022-01-27 LAB — LIPID PANEL
Chol/HDL Ratio: 3.9 ratio (ref 0.0–5.0)
Cholesterol, Total: 110 mg/dL (ref 100–199)
HDL: 28 mg/dL — ABNORMAL LOW (ref >39)
LDL Chol Calc (NIH): 66 mg/dL (ref 0–99)
Triglycerides: 78 mg/dL (ref 0–149)
VLDL Cholesterol Cal: 16 mg/dL (ref 5–40)

## 2022-01-27 MED ORDER — ISOSORBIDE MONONITRATE ER 60 MG PO TB24: 60.0000 mg | ORAL_TABLET | Freq: Every day | ORAL | 3 refills | Status: DC

## 2022-01-27 MED ORDER — ATORVASTATIN CALCIUM 80 MG PO TABS: 80.0000 mg | ORAL_TABLET | Freq: Every day | ORAL | 3 refills | Status: DC

## 2022-01-27 MED ORDER — LOSARTAN POTASSIUM 100 MG PO TABS: 100.0000 mg | ORAL_TABLET | Freq: Every day | ORAL | 3 refills | Status: DC

## 2022-01-27 MED ORDER — NITROGLYCERIN 0.4 MG SL SUBL: 0.4000 mg | SUBLINGUAL_TABLET | SUBLINGUAL | 4 refills | Status: DC | PRN

## 2022-01-27 MED ORDER — METOPROLOL SUCCINATE ER 25 MG PO TB24: 25.0000 mg | ORAL_TABLET | Freq: Two times a day (BID) | ORAL | 3 refills | Status: DC

## 2022-01-27 MED ORDER — CLOPIDOGREL BISULFATE 75 MG PO TABS: 75.0000 mg | ORAL_TABLET | Freq: Every day | ORAL | 3 refills | Status: DC

## 2022-01-27 NOTE — Patient Instructions (Signed)
Medication Instructions:  Your Physician recommend you continue on your current medication as directed.    We have sent in refills for you today!   You can try DBROX over the counter for Earwax   *If you need a refill on your cardiac medications before your next appointment, please call your pharmacy*   Lab Work: Your physician recommends that you return for lab work today on 3rd floor for CBC, CMP, and Lipid Panel  If you have labs (blood work) drawn today and your tests are completely normal, you will receive your results only by: MyChart Message (if you have [place]) OR A paper copy in the mail If you have any lab test that is abnormal or we need to change your treatment, we will call you to review the results.  Follow-Up: At [facility], you and your health needs are our priority.  As part of our continuing mission to provide you with exceptional heart care, we have created designated Provider Care Teams.  These Care Teams include your primary Cardiologist (physician) and Advanced Practice Providers (APPs -  Physician Assistants and Nurse Practitioners) who all work together to provide you with the care you need, when you need it.  We recommend signing up for the patient portal called "MyChart".  Sign up information is provided on this After Visit Summary.  MyChart is used to connect with patients for Virtual Visits (Telemedicine).  Patients are able to view lab/test results, encounter notes, upcoming appointments, etc.  Non-urgent messages can be sent to your provider as well.   To learn more about what you can do with MyChart, go to [email or web address].    Your next appointment:   1 year(s)  Provider:   Fransico Him, MD  or APP    Other Instructions Heart Healthy Diet Recommendations: A low-salt diet is recommended. Meats should be grilled, baked, or boiled. Avoid fried foods. Focus on lean protein sources like fish or chicken with vegetables and fruits. The  American Heart Association is a Microbiologist!  American Heart Association Diet and Lifeystyle Recommendations   Exercise recommendations: The American Heart Association recommends 150 minutes of moderate intensity exercise weekly. Try 30 minutes of moderate intensity exercise 4-5 times per week. This could include walking, jogging, or swimming.

## 2022-02-09 ENCOUNTER — Encounter (HOSPITAL_BASED_OUTPATIENT_CLINIC_OR_DEPARTMENT_OTHER): Payer: Self-pay

## 2022-03-14 NOTE — Telephone Encounter (Signed)
Please advise for med change  

## 2022-03-20 ENCOUNTER — Other Ambulatory Visit (HOSPITAL_BASED_OUTPATIENT_CLINIC_OR_DEPARTMENT_OTHER): Payer: Self-pay | Admitting: Cardiology

## 2022-03-20 DIAGNOSIS — I25118 Atherosclerotic heart disease of native coronary artery with other forms of angina pectoris: Secondary | ICD-10-CM

## 2022-03-20 DIAGNOSIS — I10 Essential (primary) hypertension: Secondary | ICD-10-CM

## 2022-03-24 ENCOUNTER — Other Ambulatory Visit: Payer: Self-pay | Admitting: Cardiology

## 2022-03-24 ENCOUNTER — Ambulatory Visit (HOSPITAL_BASED_OUTPATIENT_CLINIC_OR_DEPARTMENT_OTHER): Payer: Commercial Managed Care - HMO | Admitting: Family Medicine

## 2022-03-24 ENCOUNTER — Ambulatory Visit (INDEPENDENT_AMBULATORY_CARE_PROVIDER_SITE_OTHER): Payer: Commercial Managed Care - HMO

## 2022-03-24 VITALS — BP 136/85 | HR 57 | Temp 97.7°F | Ht 68.0 in | Wt 180.0 lb

## 2022-03-24 DIAGNOSIS — M25512 Pain in left shoulder: Secondary | ICD-10-CM

## 2022-03-24 DIAGNOSIS — Z0000 Encounter for general adult medical examination without abnormal findings: Secondary | ICD-10-CM

## 2022-03-24 DIAGNOSIS — Z125 Encounter for screening for malignant neoplasm of prostate: Secondary | ICD-10-CM

## 2022-03-24 DIAGNOSIS — R6882 Decreased libido: Secondary | ICD-10-CM | POA: Diagnosis not present

## 2022-03-24 DIAGNOSIS — I25118 Atherosclerotic heart disease of native coronary artery with other forms of angina pectoris: Secondary | ICD-10-CM

## 2022-03-24 DIAGNOSIS — I10 Essential (primary) hypertension: Secondary | ICD-10-CM

## 2022-03-24 NOTE — Progress Notes (Signed)
New Patient Office Visit  Subjective    Patient ID: Francisco Juarez, male    DOB: Jun 15, 1970  Age: 52 y.o. MRN: [id number]  CC: To establish care  HPI Francisco Juarez presents to establish care Last PCP - about 8 years ago, Dr. Artist Pais  Has been noting some itching in the ear canal, has been using Q tip to try and address the issue. Has been going on for about 1 year. No change in hearing. No pain. No bleeding.  Cardiovascular history: History of hypertension, hyperlipidemia, CAD status post NSTEMI.  Current medications include ASA 81, atorvastatin, clopidogrel, losartan, metoprolol, Imdur.  No recent changes to medications. Diagnosed with OSA, not using CPAP, cites cost as an issue, has been followed by cardiology related to this.  Has been having some left shoulder pain for about 3 months. Describes as burning. Was wondering if related to medication - talking with friends discussing statin and possible muscle pains as side effect. Pain worse with holding arm up overhead, continues with work and sporting activities without significant symptoms. Additionally patient has questions about checking testosterone levels.  He primarily indicates that he would like this done from a screening standpoint.  On further discussion he endorses some decreased libido.  Denies any mood symptoms, no specific erectile dysfunction concerns.  Patient is originally from [place]. Patient works in Holiday representative. Outside of work, he enjoys bowling and golfing.  Outpatient Encounter Medications as of 03/24/2022  Medication Sig   aspirin EC 81 MG EC tablet Take 1 tablet (81 mg total) by mouth daily. Swallow whole.   atorvastatin (LIPITOR) 80 MG tablet Take 1 tablet (80 mg total) by mouth daily.   clopidogrel (PLAVIX) 75 MG tablet TAKE 1 TABLET BY MOUTH DAILY   isosorbide mononitrate (IMDUR) 60 MG 24 hr tablet TAKE 1 TABLET BY MOUTH DAILY   losartan (COZAAR) 100 MG tablet Take 1 tablet (100 mg  total) by mouth daily.   metoprolol succinate (TOPROL-XL) 25 MG 24 hr tablet TAKE 1 TABLET BY MOUTH TWICE DAILY   nitroGLYCERIN (NITROSTAT) 0.4 MG SL tablet Place 1 tablet (0.4 mg total) under the tongue every 5 (five) minutes as needed for chest pain.   No facility-administered encounter medications on file as of 03/24/2022.    Past Medical History:  Diagnosis Date   Chronic pain in left shoulder    Hyperlipidemia    Hypertension    Leukocytosis 11/20/2013   referred to heamtology in 2015   OSA on CPAP    mild with AHI 8.6/hr now on CPAP   Polyarthralgia    referred to rheum in 20a5    Past Surgical History:  Procedure Laterality Date   ANTERIOR CRUCIATE LIGAMENT REPAIR     left knee   APPENDECTOMY     CORONARY PRESSURE/FFR STUDY N/A 07/06/2020   Procedure: INTRAVASCULAR PRESSURE WIRE/FFR STUDY;  Surgeon: Lyn Records, MD;  Location: [facility];  Service: Cardiovascular;  Laterality: N/A;   FOOT SURGERY     screw right foot 5th metatarsal   KNEE SURGERY     left knee ligaments repair   LEFT HEART CATH AND CORONARY ANGIOGRAPHY N/A 07/06/2020   Procedure: LEFT HEART CATH AND CORONARY ANGIOGRAPHY;  Surgeon: Lyn Records, MD;  Location: [facility];  Service: Cardiovascular;  Laterality: N/A;    Family History  Problem Relation Age of Onset   Hypertension Unknown    Diabetes Unknown    Coronary artery disease Unknown  Cancer Maternal Grandmother        lung ca    Social History   Socioeconomic History   Marital status: Married    Spouse name: Not on file   Number of children: Not on file   Years of education: Not on file   Highest education level: Not on file  Occupational History   Not on file  Tobacco Use   Smoking status: Every Day    Packs/day: 1.00    Years: 19.00    Additional pack years: 0.00    Total pack years: 19.00    Types: Cigarettes   Smokeless tobacco: Never   Tobacco comments:    quit April 2006, smoked 1 1/2 pack/day  x 10  years  Substance and Sexual Activity   Alcohol use: No   Drug use: No   Sexual activity: Yes    Birth control/protection: None  Other Topics Concern   Not on file  Social History Narrative   Reviewed history from 01/18/2007 and no changes required:         Married         Former Smoker -  quit April 2006.  smoked 1 1/2 pack/day x 10 years         Alcohol use-no         Drug use-no         Occupation:  Laboratory clinical tech    Social Determinants of Corporate investment banker Strain: Not on file  Food Insecurity: Not on file  Transportation Needs: Not on file  Physical Activity: Not on file  Stress: Not on file  Social Connections: Not on file  Intimate Partner Violence: Not on file    Objective    BP 136/85 (BP Location: Left Arm, Patient Position: Sitting, Cuff Size: Normal)   Pulse (!) 57   Temp 97.7 F (36.5 C) (Oral)   Ht 5\' 8"  (1.727 m)   Wt 180 lb (81.6 kg)   SpO2 100%   BMI 27.37 kg/m   Physical Exam  52 year old male in no acute distress Cardiovascular exam regular rate and rhythm Lungs clear to auscultation bilaterally Left shoulder: Obvious swelling, bruising or erythema: absent Deformity of the shoulder: absent Mild tenderness palpation over AC joint Active ROM: diminished range with pain Passive ROM: full range with pain Strength: normal/normal Empty can: Negative Hawkins: Positive Neer's: Positive Some pain with passively bringing arm across the body. Neurovascular exam: intact  Assessment & Plan:   Problem List Items Addressed This Visit       Other   Decreased libido    Possibly related to low testosterone level.  Discussed that oftentimes we may see additional symptoms in setting of low testosterone, however we can proceed with checking testosterone levels today.  If found to be low, would need repeat test in about 2 weeks in order to verify decreased testosterone level.  If found to be low, can proceed with referral to urology for  further evaluation and treatment      Relevant Orders   Testosterone (Completed)   Left shoulder pain - Primary    Possibility to [facility] joint arthritis.  Discussed this as likely possibility, can proceed with x-ray imaging for further evaluation and to assess for any AC joint changes.  Discussed general conservative measures.  Discussed possibility of proceeding with steroid injection to help with symptoms which can be arranged as desired after completion of x-ray imaging.  Additional consideration includes OTC medications, working physical  therapy      Relevant Orders   DG Shoulder Left (Completed)   Other Visit Diagnoses     Wellness examination       Relevant Orders   PSA Total (Reflex To Free) (Completed)   Hemoglobin A1c (Completed)   TSH Rfx on Abnormal to Free T4 (Completed)   Prostate cancer screening       Relevant Orders   PSA Total (Reflex To Free) (Completed)      Return in about 3 months (around 06/24/2022) for CPE.   Spent 50 minutes on this patient encounter, including preparation, chart review, face-to-face counseling with patient and coordination of care, and documentation of encounter  Jenisha Faison J [place], MD

## 2022-04-12 LAB — TESTOSTERONE: Testosterone: 732 ng/dL (ref 264–916)

## 2022-04-12 LAB — HEMOGLOBIN A1C
Est. average glucose Bld gHb Est-mCnc: 131 mg/dL
Hgb A1c MFr Bld: 6.2 % — ABNORMAL HIGH (ref 4.8–5.6)

## 2022-04-12 LAB — TSH RFX ON ABNORMAL TO FREE T4: TSH: 1.83 u[IU]/mL (ref 0.450–4.500)

## 2022-04-12 LAB — PSA TOTAL (REFLEX TO FREE): Prostate Specific Ag, Serum: 0.8 ng/mL (ref 0.0–4.0)

## 2022-04-24 DIAGNOSIS — M25512 Pain in left shoulder: Secondary | ICD-10-CM | POA: Insufficient documentation

## 2022-04-24 NOTE — Assessment & Plan Note (Signed)
Possibly related to low testosterone level.  Discussed that oftentimes we may see additional symptoms in setting of low testosterone, however we can proceed with checking testosterone levels today.  If found to be low, would need repeat test in about 2 weeks in order to verify decreased testosterone level.  If found to be low, can proceed with referral to urology for further evaluation and treatment

## 2022-04-24 NOTE — Assessment & Plan Note (Signed)
Possibility to [facility] joint arthritis.  Discussed this as likely possibility, can proceed with x-ray imaging for further evaluation and to assess for any AC joint changes.  Discussed general conservative measures.  Discussed possibility of proceeding with steroid injection to help with symptoms which can be arranged as desired after completion of x-ray imaging.  Additional consideration includes OTC medications, working physical therapy

## 2022-07-03 ENCOUNTER — Ambulatory Visit (INDEPENDENT_AMBULATORY_CARE_PROVIDER_SITE_OTHER): Payer: Commercial Managed Care - HMO | Admitting: Family Medicine

## 2022-07-03 ENCOUNTER — Encounter (HOSPITAL_BASED_OUTPATIENT_CLINIC_OR_DEPARTMENT_OTHER): Payer: Self-pay | Admitting: Family Medicine

## 2022-07-03 VITALS — BP 155/93 | HR 50 | Ht 68.0 in | Wt 180.0 lb

## 2022-07-03 DIAGNOSIS — R7303 Prediabetes: Secondary | ICD-10-CM | POA: Insufficient documentation

## 2022-07-03 DIAGNOSIS — Z0000 Encounter for general adult medical examination without abnormal findings: Secondary | ICD-10-CM | POA: Insufficient documentation

## 2022-07-03 DIAGNOSIS — Z1211 Encounter for screening for malignant neoplasm of colon: Secondary | ICD-10-CM

## 2022-07-03 DIAGNOSIS — F17200 Nicotine dependence, unspecified, uncomplicated: Secondary | ICD-10-CM

## 2022-07-03 NOTE — Assessment & Plan Note (Signed)
Routine HCM labs reviewed. HCM reviewed/discussed. Anticipatory guidance regarding healthy weight, lifestyle and choices given. Recommend healthy diet.  Recommend approximately 150 minutes/week of moderate intensity exercise Recommend regular dental and vision exams Always use seatbelt/lap and shoulder restraints Recommend using smoke alarms and checking batteries at least twice a year Recommend using sunscreen when outside Discussed colon cancer screening recommendations, options.  Patient open to colonoscopy, order placed Discussed immunization recommendations

## 2022-07-03 NOTE — Patient Instructions (Signed)
  Medication Instructions:  Your physician recommends that you continue on your current medications as directed. Please refer to the Current Medication list given to you today. --If you need a refill on any your medications before your next appointment, please call your pharmacy first. If no refills are authorized on file call the office.-- Lab Work: Your physician has recommended that you have lab work today: No If you have labs (blood work) drawn today and your tests are completely normal, you will receive your results via MyChart message OR a phone call from our staff.  Please ensure you check your voicemail in the event that you authorized detailed messages to be left on a delegated number. If you have any lab test that is abnormal or we need to change your treatment, we will call you to review the results.  Referrals/Procedures/Imaging: Yes  Follow-Up: Your next appointment:   Your physician recommends that you schedule a follow-up appointment in: 4-6 months with Dr. de [place]  You will receive a text message or e-mail with a link to a survey about your care and experience with us today! We would greatly appreciate your feedback!   Thanks for letting us be apart of your health journey!!  Primary Care and Sports Medicine   Dr. Raymond de Cuba   We encourage you to activate your patient portal called "MyChart".  Sign up information is provided on this After Visit Summary.  MyChart is used to connect with patients for Virtual Visits (Telemedicine).  Patients are able to view lab/test results, encounter notes, upcoming appointments, etc.  Non-urgent messages can be sent to your provider as well. To learn more about what you can do with MyChart, please visit --  [email or web address].    

## 2022-07-03 NOTE — Assessment & Plan Note (Signed)
Tobacco use - 30 years, 1 ppd. Active smoker. Discussed recommendations pertaining lung cancer screening. Some variation between societies but AAFP and USPSTF recommend annual screening for patients ages 63-80 years with >20 pack-year smoking history and current smoker or quit within past 15 years. Patient meets criteria. Patient amenable to proceeding with screening, order placed.

## 2022-07-03 NOTE — Progress Notes (Signed)
Subjective:    CC: Annual Physical Exam  HPI:  Francisco Juarez is a 52 y.o. presenting for annual physical  I reviewed the past medical history, family history, social history, surgical history, and allergies today and no changes were needed.  Please see the problem list section below in epic for further details.  Past Medical History: Past Medical History:  Diagnosis Date   Chronic pain in left shoulder    Hyperlipidemia    Hypertension    Leukocytosis 11/20/2013   referred to heamtology in 2015   OSA on CPAP    mild with AHI 8.6/hr now on CPAP   Polyarthralgia    referred to rheum in 20a5   Past Surgical History: Past Surgical History:  Procedure Laterality Date   ANTERIOR CRUCIATE LIGAMENT REPAIR     left knee   APPENDECTOMY     CORONARY PRESSURE/FFR STUDY N/A 07/06/2020   Procedure: INTRAVASCULAR PRESSURE WIRE/FFR STUDY;  Surgeon: Lyn Records, MD;  Location: [facility];  Service: Cardiovascular;  Laterality: N/A;   FOOT SURGERY     screw right foot 5th metatarsal   KNEE SURGERY     left knee ligaments repair   LEFT HEART CATH AND CORONARY ANGIOGRAPHY N/A 07/06/2020   Procedure: LEFT HEART CATH AND CORONARY ANGIOGRAPHY;  Surgeon: Lyn Records, MD;  Location: [facility];  Service: Cardiovascular;  Laterality: N/A;   Social History: Social History   Socioeconomic History   Marital status: Married    Spouse name: Not on file   Number of children: Not on file   Years of education: Not on file   Highest education level: Not on file  Occupational History   Not on file  Tobacco Use   Smoking status: Every Day    Packs/day: 1.00    Years: 19.00    Additional pack years: 0.00    Total pack years: 19.00    Types: Cigarettes   Smokeless tobacco: Never   Tobacco comments:    quit April 2006, smoked 1 1/2 pack/day  x 10 years  Substance and Sexual Activity   Alcohol use: No   Drug use: No   Sexual activity: Yes    Birth  control/protection: None  Other Topics Concern   Not on file  Social History Narrative   Reviewed history from 01/18/2007 and no changes required:         Married         Former Smoker -  quit April 2006.  smoked 1 1/2 pack/day x 10 years         Alcohol use-no         Drug use-no         Occupation:  Laboratory clinical tech    Social Determinants of Corporate investment banker Strain: Not on file  Food Insecurity: Not on file  Transportation Needs: Not on file  Physical Activity: Not on file  Stress: Not on file  Social Connections: Not on file   Family History: Family History  Problem Relation Age of Onset   Hypertension Unknown    Diabetes Unknown    Coronary artery disease Unknown    Cancer Maternal Grandmother        lung ca   Allergies: No Known Allergies Medications: See med rec.  Review of Systems: No headache, visual changes, nausea, vomiting, diarrhea, constipation, dizziness, abdominal pain, skin rash, fevers, chills, night sweats, swollen lymph nodes, weight loss, chest pain, body  aches, joint swelling, muscle aches, shortness of breath, mood changes, visual or auditory hallucinations.  Objective:    BP (!) 155/93 (BP Location: Right Arm, Patient Position: Sitting, Cuff Size: Large)   Pulse (!) 50   Ht 5\' 8"  (1.727 m)   Wt 180 lb (81.6 kg)   SpO2 100%   BMI 27.37 kg/m   General: Well Developed, well nourished, and in no acute distress. Neuro: Alert and oriented x3, extra-ocular muscles intact, sensation grossly intact. Cranial nerves II through XII are intact, motor, sensory, and coordinative functions are all intact. HEENT: Normocephalic, atraumatic, pupils equal round reactive to light, neck supple, no masses, no lymphadenopathy, thyroid nonpalpable. Oropharynx, nasopharynx, external ear canals are unremarkable. Skin: Warm and dry, no rashes noted. Cardiac: Regular rate and rhythm, no murmurs rubs or gallops. Respiratory: Clear to auscultation  bilaterally. Not using accessory muscles, speaking in full sentences. Abdominal: Soft, nontender, nondistended, positive bowel sounds, no masses, no organomegaly. Musculoskeletal: Shoulder, elbow, wrist, hip, knee, ankle stable, and with full range of motion.  Impression and Recommendations:    Wellness examination Assessment & Plan: Routine HCM labs reviewed. HCM reviewed/discussed. Anticipatory guidance regarding healthy weight, lifestyle and choices given. Recommend healthy diet.  Recommend approximately 150 minutes/week of moderate intensity exercise Recommend regular dental and vision exams Always use seatbelt/lap and shoulder restraints Recommend using smoke alarms and checking batteries at least twice a year Recommend using sunscreen when outside Discussed colon cancer screening recommendations, options.  Patient open to colonoscopy, order placed Discussed immunization recommendations   Tobacco use disorder Assessment & Plan: Tobacco use - 30 years, 1 ppd. Active smoker. Discussed recommendations pertaining lung cancer screening. Some variation between societies but AAFP and USPSTF recommend annual screening for patients ages 59-80 years with >20 pack-year smoking history and current smoker or quit within past 15 years. Patient meets criteria. Patient amenable to proceeding with screening, order placed.  Orders: -     CT CHEST LUNG CANCER SCREENING LOW DOSE WO CONTRAST; Future  Colon cancer screening -     Ambulatory referral to Gastroenterology  Return in about 4 months (around 11/03/2022) for prediabetes, hypertension.   ___________________________________________ Francisco Nicotra de [place], MD, ABFM, CAQSM Primary Care and Sports Medicine [facility]

## 2022-07-12 ENCOUNTER — Emergency Department (HOSPITAL_COMMUNITY)
Admission: EM | Admit: 2022-07-12 | Discharge: 2022-07-12 | Disposition: A | Discharge status: Home / Self Care | Payer: Commercial Managed Care - HMO | Attending: Emergency Medicine | Admitting: Emergency Medicine

## 2022-07-12 ENCOUNTER — Emergency Department (HOSPITAL_COMMUNITY): Payer: Commercial Managed Care - HMO

## 2022-07-12 ENCOUNTER — Other Ambulatory Visit: Payer: Self-pay

## 2022-07-12 ENCOUNTER — Encounter (HOSPITAL_COMMUNITY): Payer: Self-pay | Admitting: Emergency Medicine

## 2022-07-12 DIAGNOSIS — I10 Essential (primary) hypertension: Secondary | ICD-10-CM | POA: Diagnosis not present

## 2022-07-12 DIAGNOSIS — M79605 Pain in left leg: Secondary | ICD-10-CM | POA: Insufficient documentation

## 2022-07-12 DIAGNOSIS — Z79899 Other long term (current) drug therapy: Secondary | ICD-10-CM | POA: Diagnosis not present

## 2022-07-12 DIAGNOSIS — Z7902 Long term (current) use of antithrombotics/antiplatelets: Secondary | ICD-10-CM | POA: Insufficient documentation

## 2022-07-12 DIAGNOSIS — M545 Low back pain, unspecified: Secondary | ICD-10-CM | POA: Insufficient documentation

## 2022-07-12 DIAGNOSIS — Z7982 Long term (current) use of aspirin: Secondary | ICD-10-CM | POA: Insufficient documentation

## 2022-07-12 DIAGNOSIS — R519 Headache, unspecified: Secondary | ICD-10-CM | POA: Diagnosis not present

## 2022-07-12 DIAGNOSIS — W11XXXA Fall on and from ladder, initial encounter: Secondary | ICD-10-CM | POA: Diagnosis not present

## 2022-07-12 DIAGNOSIS — M79604 Pain in right leg: Secondary | ICD-10-CM | POA: Insufficient documentation

## 2022-07-12 NOTE — ED Triage Notes (Addendum)
Pt fell off ladder approximately 8 feet onto cement this morning. States he landed on feet and onto buttocks and then hit head. Denies LOC. Back pain and leg pain and headache. Takes Plavix. Unsure why he did not come in earlier.

## 2022-07-12 NOTE — Discharge Instructions (Addendum)
Thank you for allowing me to be a part of your care today.  You were evaluated in the ED after a fall.   Your CT scans did not show evidence of trauma or fracture to your spine or brain.  Your lumbar CT did show broad-base disc bulges from L3-4 through L5-S1 with mild to moderate central canal stenosis at L3-4 and L4-5.  If you continue to have pain in your lower back, I recommend following up with your primary care provider or an orthopedic provider.   I recommend taking [phone] mg of Tylenol every 6 hours as needed for pain.  Because you take aspirin and Plavix, avoid taking NSAIDs such as ibuprofen and naproxen as this increases your risk of bleeding in the stomach.   Return to the ED if you develop sudden worsening of your symptoms or if you have any new concerns.

## 2022-07-12 NOTE — ED Notes (Signed)
Patient transported to CT 

## 2022-07-12 NOTE — ED Provider Notes (Signed)
[facility] AT [facility] Provider Note   CSN: [id number] Arrival date & time: 07/12/22  1901     History  Chief Complaint  Patient presents with   fall on thinners    Francisco Juarez is a 52 y.o. male with past medical history significant for hypertension, hyperlipidemia, chronic antiplatelet therapy with Plavix presents to the ED complaining of headache, lower back pain, and bilateral leg pain after a fall from a ladder.  Patient states he was approximately 8 feet off the ground and coming down from the ladder when the ladder went out from underneath him causing him to fall onto cement.  He states he landed on his feet initially, but quickly fell onto his buttocks and head.  Patient has been ambulatory since the accident without issues.  He states he was going to "just take medicine at home"  but family insisted he come be evaluated.  Denies numbness, tingling or weakness in the extremities, loss of bladder or bowel control, dizziness, lightheadedness, visual disturbance, nausea, vomiting, neck pain.       Home Medications Prior to Admission medications   Medication Sig Start Date End Date Taking? Authorizing Provider  aspirin EC 81 MG EC tablet Take 1 tablet (81 mg total) by mouth daily. Swallow whole. 07/07/20   Kathlen Mody, MD  atorvastatin (LIPITOR) 80 MG tablet Take 1 tablet (80 mg total) by mouth daily. 01/27/22   Alver Sorrow, NP  clopidogrel (PLAVIX) 75 MG tablet TAKE 1 TABLET BY MOUTH DAILY 03/20/22   Quintella Reichert, MD  isosorbide mononitrate (IMDUR) 60 MG 24 hr tablet TAKE 1 TABLET BY MOUTH DAILY 03/20/22   Quintella Reichert, MD  losartan (COZAAR) 100 MG tablet Take 1 tablet (100 mg total) by mouth daily. 01/27/22 01/22/23  Alver Sorrow, NP  metoprolol succinate (TOPROL-XL) 25 MG 24 hr tablet TAKE 1 TABLET BY MOUTH TWICE DAILY 03/20/22   Quintella Reichert, MD  nitroGLYCERIN (NITROSTAT) 0.4 MG SL tablet Place 1 tablet (0.4 mg total)  under the tongue every 5 (five) minutes as needed for chest pain. 01/27/22   Alver Sorrow, NP      Allergies    Patient has no known allergies.    Review of Systems   Review of Systems  Eyes:  Negative for visual disturbance.  Gastrointestinal:  Negative for nausea and vomiting.  Musculoskeletal:  Positive for arthralgias and back pain. Negative for gait problem and neck pain.  Neurological:  Positive for headaches. Negative for dizziness, weakness, light-headedness and numbness.    Physical Exam Updated Vital Signs BP (!) 133/93   Pulse (!) 57   Temp 98.2 F (36.8 C) (Oral)   Resp 18   Ht 5' 8.5" (1.74 m)   Wt 81.6 kg   SpO2 98%   BMI 26.97 kg/m  Physical Exam Vitals and nursing note reviewed.  Constitutional:      General: He is not in acute distress.    Appearance: Normal appearance. He is not ill-appearing or diaphoretic.  Cardiovascular:     Rate and Rhythm: Normal rate and regular rhythm.  Pulmonary:     Effort: Pulmonary effort is normal.  Musculoskeletal:     Cervical back: Normal and full passive range of motion without pain. No tenderness or bony tenderness. No pain with movement.     Thoracic back: Normal. No deformity, tenderness or bony tenderness. Normal range of motion.     Lumbar back: Tenderness (mild,  bilateral paraspinal) present. No swelling, deformity, signs of trauma, spasms or bony tenderness. Normal range of motion. Negative right straight leg raise test and negative left straight leg raise test.     Comments: No obvious gross deformities to bilateral lower extremities.  Patient has full ROM of hips, knees, and ankles.  No tenderness to palpation of bilateral lower extremities.   Neurological:     Mental Status: He is alert. Mental status is at baseline.     Sensory: Sensation is intact.     Motor: Motor function is intact.     Coordination: Coordination is intact.     Gait: Gait is intact.     Comments: Patient moves all extremities  appropriately with 5/5 strength.    Psychiatric:        Mood and Affect: Mood normal.        Behavior: Behavior normal.     ED Results / Procedures / Treatments   Labs (all labs ordered are listed, but only abnormal results are displayed) Labs Reviewed - No data to display  EKG None  Radiology CT Thoracic Spine Wo Contrast  Result Date: 07/12/2022 CLINICAL DATA:  Back trauma. Patient fell off a ladder approximately 8 feet onto cement this morning. Landed on feet and buttocks. EXAM: CT THORACIC SPINE WITHOUT CONTRAST TECHNIQUE: Multidetector CT images of the thoracic were obtained using the standard protocol without intravenous contrast. RADIATION DOSE REDUCTION: This exam was performed according to the departmental dose-optimization program which includes automated exposure control, adjustment of the mA and/or kV according to patient size and/or use of iterative reconstruction technique. COMPARISON:  None Available. FINDINGS: Alignment: Normal. Vertebrae: No acute fracture or focal pathologic process. Paraspinal and other soft tissues: Negative. Disc levels: Disc heights are maintained. No significant disc bulge, spinal canal or neural foraminal stenosis. IMPRESSION: 1. No acute fracture or traumatic subluxation. 2. Disc heights are maintained. No significant disc bulge, spinal canal or neural foraminal stenosis. Electronically Signed   By: Larose Hires D.O.   On: 07/12/2022 20:52   CT Lumbar Spine Wo Contrast  Result Date: 07/12/2022 CLINICAL DATA:  Back trauma, no prior imaging (Age >= 16y). Fall off ladder. EXAM: CT LUMBAR SPINE WITHOUT CONTRAST TECHNIQUE: Multidetector CT imaging of the lumbar spine was performed without intravenous contrast administration. Multiplanar CT image reconstructions were also generated. RADIATION DOSE REDUCTION: This exam was performed according to the departmental dose-optimization program which includes automated exposure control, adjustment of the mA and/or kV  according to patient size and/or use of iterative reconstruction technique. COMPARISON:  None Available. FINDINGS: Segmentation: 5 lumbar type vertebrae. Alignment: Normal Vertebrae: No acute fracture or focal pathologic process. Paraspinal and other soft tissues: Negative Disc levels: Disc spaces are maintained. Broad-based disc bulges noted at L3-4 with mild central spinal stenosis, L4-5 with mild to moderate central spinal stenosis and L5-S1. IMPRESSION: No acute bony abnormality. Broad-base disc bulges from L3-4 through L5-S1. Mild to moderate central canal stenosis at L3-4 and L4-5. Electronically Signed   By: Charlett Nose M.D.   On: 07/12/2022 20:07   CT Head Wo Contrast  Result Date: 07/12/2022 CLINICAL DATA:  Facial trauma. EXAM: CT HEAD WITHOUT CONTRAST TECHNIQUE: Contiguous axial images were obtained from the base of the skull through the vertex without intravenous contrast. RADIATION DOSE REDUCTION: This exam was performed according to the departmental dose-optimization program which includes automated exposure control, adjustment of the mA and/or kV according to patient size and/or use of iterative reconstruction technique. COMPARISON:  MRI  brain 04/12/2006 FINDINGS: Brain: No evidence of acute infarction, hemorrhage, hydrocephalus, extra-axial collection or mass lesion/mass effect. Vascular: No hyperdense vessel or unexpected calcification. Skull: Normal. Negative for fracture or focal lesion. Sinuses/Orbits: No acute finding. Other: None. IMPRESSION: No acute intracranial abnormality. Electronically Signed   By: Darliss Cheney M.D.   On: 07/12/2022 20:06    Procedures Procedures    Medications Ordered in ED Medications - No data to display  ED Course/ Medical Decision Making/ A&P                             Medical Decision Making Amount and/or Complexity of Data Reviewed Radiology: ordered.   This patient presents to the ED with chief complaint(s) of headache, back pain and lower  leg pain after fall after ladder with pertinent past medical history of on Plavix and ASA, HTN.  The complaint involves an extensive differential diagnosis and also carries with it a high risk of complications and morbidity.    The differential diagnosis includes acute intracranial injury/hemorrhage, skull fracture, acute fracture or subluxation of thoracic or lumbar spine, contusion, musculoskeletal strain   The initial plan is to obtain CT head, thoracic and lumbar   Initial Assessment:   Exam significant for overall well appearing patient who is not in acute distress.  Head is normocephalic, atraumatic.  Spine without obvious deformities or step offs.  No midline tenderness.  Mild tenderness to palpation of lumbar paraspinal muscles.  Patient moving all extremities appropriately with 5/5 strength in BLE.  Sensation is grossly intact.  Patient is able to walk without assistance.   Independent visualization and interpretation of imaging: I independently visualized the following imaging with scope of interpretation limited to determining acute life threatening conditions related to emergency care: CT head, thoracic and lumbar spine, which revealed no acute intracranial injury, skull fracture, spinal fracture or subluxation in the spine.  There are disc bulges and spinal stenosis in lumbar spine.    Disposition:   Discussed supportive care measures for traumatic back pain with patient.  Recommended PCP follow up if symptoms do not improve.  The patient has been appropriately medically screened and/or stabilized in the ED. I have low suspicion for any other emergent medical condition which would require further screening, evaluation or treatment in the ED or require inpatient management. At time of discharge the patient is hemodynamically stable and in no acute distress. I have discussed work-up results and diagnosis with patient and answered all questions. Patient is agreeable with discharge plan. We  discussed strict return precautions for returning to the emergency department and they verbalized understanding.            Final Clinical Impression(s) / ED Diagnoses Final diagnoses:  Fall from ladder, initial encounter  Bilateral leg pain  Acute bilateral low back pain without sciatica    Rx / DC Orders ED Discharge Orders     None         Lenard Simmer, PA-C 07/12/22 2127    Lonell Grandchild, MD 07/12/22 2138

## 2022-07-14 ENCOUNTER — Encounter (HOSPITAL_BASED_OUTPATIENT_CLINIC_OR_DEPARTMENT_OTHER): Payer: Self-pay | Admitting: *Deleted

## 2022-07-14 ENCOUNTER — Telehealth (HOSPITAL_BASED_OUTPATIENT_CLINIC_OR_DEPARTMENT_OTHER): Payer: Self-pay | Admitting: *Deleted

## 2022-07-14 NOTE — Telephone Encounter (Signed)
LVM to schedule colon cancer screening. Mychart message sent 

## 2022-07-24 ENCOUNTER — Encounter (HOSPITAL_BASED_OUTPATIENT_CLINIC_OR_DEPARTMENT_OTHER): Payer: Self-pay | Admitting: Family Medicine

## 2022-07-25 ENCOUNTER — Encounter (HOSPITAL_BASED_OUTPATIENT_CLINIC_OR_DEPARTMENT_OTHER): Payer: Self-pay | Admitting: Family Medicine

## 2022-07-26 ENCOUNTER — Encounter: Payer: Self-pay | Admitting: Internal Medicine

## 2022-07-26 ENCOUNTER — Ambulatory Visit
Admission: RE | Admit: 2022-07-26 | Discharge: 2022-07-26 | Disposition: A | Discharge status: Home / Self Care | Payer: Commercial Managed Care - HMO | Source: Ambulatory Visit | Attending: Family Medicine | Admitting: Family Medicine

## 2022-07-26 DIAGNOSIS — F17200 Nicotine dependence, unspecified, uncomplicated: Secondary | ICD-10-CM

## 2022-08-22 ENCOUNTER — Other Ambulatory Visit: Payer: Self-pay

## 2022-08-22 MED ORDER — ASPIRIN 81 MG PO TBEC: 81.0000 mg | DELAYED_RELEASE_TABLET | Freq: Every day | ORAL | 1 refills | Status: DC

## 2022-09-05 ENCOUNTER — Other Ambulatory Visit: Payer: Self-pay | Admitting: *Deleted

## 2022-09-05 DIAGNOSIS — I10 Essential (primary) hypertension: Secondary | ICD-10-CM

## 2022-09-05 DIAGNOSIS — I25118 Atherosclerotic heart disease of native coronary artery with other forms of angina pectoris: Secondary | ICD-10-CM

## 2022-09-05 MED ORDER — ISOSORBIDE MONONITRATE ER 60 MG PO TB24
60.0000 mg | ORAL_TABLET | Freq: Every day | ORAL | 1 refills | Status: DC
Start: 2022-09-05 — End: 2023-03-13

## 2022-09-05 MED ORDER — CLOPIDOGREL BISULFATE 75 MG PO TABS: 75.0000 mg | ORAL_TABLET | Freq: Every day | ORAL | 1 refills | Status: DC

## 2022-09-05 MED ORDER — METOPROLOL SUCCINATE ER 25 MG PO TB24: 25.0000 mg | ORAL_TABLET | Freq: Two times a day (BID) | ORAL | 1 refills | Status: DC

## 2022-09-05 MED ORDER — ASPIRIN 81 MG PO TBEC: 81.0000 mg | DELAYED_RELEASE_TABLET | Freq: Every day | ORAL | 1 refills | Status: AC

## 2022-09-05 MED ORDER — LOSARTAN POTASSIUM 100 MG PO TABS
100.0000 mg | ORAL_TABLET | Freq: Every day | ORAL | 1 refills | Status: DC
Start: 2022-09-05 — End: 2023-03-13

## 2022-09-05 MED ORDER — ATORVASTATIN CALCIUM 80 MG PO TABS: 80.0000 mg | ORAL_TABLET | Freq: Every day | ORAL | 1 refills | Status: DC

## 2022-09-05 MED ORDER — NITROGLYCERIN 0.4 MG SL SUBL: 0.4000 mg | SUBLINGUAL_TABLET | SUBLINGUAL | 1 refills | Status: DC | PRN

## 2022-09-07 ENCOUNTER — Other Ambulatory Visit: Payer: Self-pay

## 2022-09-07 MED ORDER — NITROGLYCERIN 0.4 MG SL SUBL: 0.4000 mg | SUBLINGUAL_TABLET | SUBLINGUAL | 1 refills | Status: DC | PRN

## 2022-09-27 ENCOUNTER — Telehealth (HOSPITAL_BASED_OUTPATIENT_CLINIC_OR_DEPARTMENT_OTHER): Payer: Self-pay | Admitting: *Deleted

## 2022-09-27 NOTE — Telephone Encounter (Signed)
LVM to schedule bp check

## 2022-10-06 ENCOUNTER — Other Ambulatory Visit: Payer: Self-pay | Admitting: Family Medicine

## 2022-10-06 DIAGNOSIS — Z1212 Encounter for screening for malignant neoplasm of rectum: Secondary | ICD-10-CM

## 2022-10-06 DIAGNOSIS — Z1211 Encounter for screening for malignant neoplasm of colon: Secondary | ICD-10-CM

## 2022-10-24 ENCOUNTER — Ambulatory Visit: Payer: Managed Care, Other (non HMO) | Admitting: Internal Medicine

## 2022-11-06 LAB — COLOGUARD: COLOGUARD: NEGATIVE

## 2022-11-20 ENCOUNTER — Ambulatory Visit (HOSPITAL_BASED_OUTPATIENT_CLINIC_OR_DEPARTMENT_OTHER): Payer: Commercial Managed Care - HMO | Admitting: Family Medicine

## 2022-11-22 ENCOUNTER — Ambulatory Visit (HOSPITAL_BASED_OUTPATIENT_CLINIC_OR_DEPARTMENT_OTHER): Payer: Commercial Managed Care - HMO | Admitting: Family Medicine

## 2022-11-22 ENCOUNTER — Encounter (HOSPITAL_BASED_OUTPATIENT_CLINIC_OR_DEPARTMENT_OTHER): Payer: Self-pay

## 2022-11-28 ENCOUNTER — Encounter (HOSPITAL_BASED_OUTPATIENT_CLINIC_OR_DEPARTMENT_OTHER): Payer: Self-pay | Admitting: *Deleted

## 2022-11-28 ENCOUNTER — Telehealth (HOSPITAL_BASED_OUTPATIENT_CLINIC_OR_DEPARTMENT_OTHER): Payer: Self-pay | Admitting: *Deleted

## 2022-11-28 NOTE — Telephone Encounter (Signed)
LVM to reschedule 11/20 appt

## 2023-01-18 ENCOUNTER — Telehealth (HOSPITAL_BASED_OUTPATIENT_CLINIC_OR_DEPARTMENT_OTHER): Payer: Self-pay | Admitting: *Deleted

## 2023-01-18 ENCOUNTER — Encounter (HOSPITAL_BASED_OUTPATIENT_CLINIC_OR_DEPARTMENT_OTHER): Payer: Self-pay | Admitting: *Deleted

## 2023-01-18 NOTE — Telephone Encounter (Signed)
LVM for pt to call the office to schedule an appointment to follow up. Mychart msg sent as well

## 2023-02-02 ENCOUNTER — Telehealth (HOSPITAL_BASED_OUTPATIENT_CLINIC_OR_DEPARTMENT_OTHER): Payer: Self-pay | Admitting: *Deleted

## 2023-02-02 NOTE — Telephone Encounter (Signed)
LVM for pt to schedule a follow up appt with de [place]

## 2023-03-12 ENCOUNTER — Encounter (HOSPITAL_BASED_OUTPATIENT_CLINIC_OR_DEPARTMENT_OTHER): Payer: Self-pay | Admitting: Cardiology

## 2023-03-12 DIAGNOSIS — I25118 Atherosclerotic heart disease of native coronary artery with other forms of angina pectoris: Secondary | ICD-10-CM

## 2023-03-12 DIAGNOSIS — I10 Essential (primary) hypertension: Secondary | ICD-10-CM

## 2023-03-13 MED ORDER — ISOSORBIDE MONONITRATE ER 60 MG PO TB24
60.0000 mg | ORAL_TABLET | Freq: Every day | ORAL | 0 refills | Status: AC
Start: 2023-03-13 — End: ?

## 2023-03-13 MED ORDER — LOSARTAN POTASSIUM 100 MG PO TABS
100.0000 mg | ORAL_TABLET | Freq: Every day | ORAL | 0 refills | Status: AC
Start: 2023-03-13 — End: 2024-03-07

## 2023-03-13 MED ORDER — CLOPIDOGREL BISULFATE 75 MG PO TABS: 75.0000 mg | ORAL_TABLET | Freq: Every day | ORAL | 0 refills | Status: DC

## 2023-03-13 MED ORDER — METOPROLOL SUCCINATE ER 25 MG PO TB24: 25.0000 mg | ORAL_TABLET | Freq: Two times a day (BID) | ORAL | 0 refills | Status: DC

## 2023-03-13 MED ORDER — ATORVASTATIN CALCIUM 80 MG PO TABS: 80.0000 mg | ORAL_TABLET | Freq: Every day | ORAL | 0 refills | Status: DC

## 2023-03-15 ENCOUNTER — Ambulatory Visit (INDEPENDENT_AMBULATORY_CARE_PROVIDER_SITE_OTHER)

## 2023-03-15 ENCOUNTER — Other Ambulatory Visit: Payer: Self-pay | Admitting: Cardiology

## 2023-03-15 ENCOUNTER — Ambulatory Visit: Attending: Cardiology | Admitting: Cardiology

## 2023-03-15 VITALS — BP 130/99 | HR 64 | Ht 65.0 in | Wt 176.6 lb

## 2023-03-15 DIAGNOSIS — I25118 Atherosclerotic heart disease of native coronary artery with other forms of angina pectoris: Secondary | ICD-10-CM | POA: Diagnosis not present

## 2023-03-15 DIAGNOSIS — E785 Hyperlipidemia, unspecified: Secondary | ICD-10-CM

## 2023-03-15 DIAGNOSIS — R002 Palpitations: Secondary | ICD-10-CM

## 2023-03-15 DIAGNOSIS — G4733 Obstructive sleep apnea (adult) (pediatric): Secondary | ICD-10-CM

## 2023-03-15 DIAGNOSIS — I10 Essential (primary) hypertension: Secondary | ICD-10-CM

## 2023-03-15 LAB — COMPREHENSIVE METABOLIC PANEL
ALT: 25 IU/L (ref 0–44)
AST: 22 IU/L (ref 0–40)
Albumin: 4.4 g/dL (ref 3.8–4.9)
Alkaline Phosphatase: 156 IU/L — ABNORMAL HIGH (ref 44–121)
BUN/Creatinine Ratio: 8 — ABNORMAL LOW (ref 9–20)
BUN: 9 mg/dL (ref 6–24)
Bilirubin Total: 0.5 mg/dL (ref 0.0–1.2)
CO2: 22 mmol/L (ref 20–29)
Calcium: 9.7 mg/dL (ref 8.7–10.2)
Chloride: 105 mmol/L (ref 96–106)
Creatinine, Ser: 1.09 mg/dL (ref 0.76–1.27)
Globulin, Total: 2 g/dL (ref 1.5–4.5)
Glucose: 90 mg/dL (ref 70–99)
Potassium: 4.9 mmol/L (ref 3.5–5.2)
Sodium: 139 mmol/L (ref 134–144)
Total Protein: 6.4 g/dL (ref 6.0–8.5)
eGFR: 82 mL/min/{1.73_m2} (ref >59)

## 2023-03-15 LAB — LIPID PANEL
Chol/HDL Ratio: 3.6 ratio (ref 0.0–5.0)
Cholesterol, Total: 96 mg/dL — ABNORMAL LOW (ref 100–199)
HDL: 27 mg/dL — ABNORMAL LOW (ref >39)
LDL Chol Calc (NIH): 53 mg/dL (ref 0–99)
Triglycerides: 75 mg/dL (ref 0–149)
VLDL Cholesterol Cal: 16 mg/dL (ref 5–40)

## 2023-03-15 NOTE — Progress Notes (Signed)
 Office Visit    Patient Name: Francisco Juarez Date of Encounter: 03/15/2023  PCP:  de Peru, Raymond J, MD   [facility]  Cardiologist:  Armanda Magic, MD  Electrophysiologist:  None   Chief Complaint    Francisco Juarez is a 53 y.o. male with a hx of hypertension, hyperlipidemia, tobacco use, CAD s/p NSTEMI presents today for follow-up.  Due to complaints of excessive daytime sleepiness and snoring, he underwent sleep study showing mild OSA with an AHI of 8.6/hr and O2 sats as low as 87%.  He underwent CPAP titration to 9cm H2O and is now here for followup.   He stopped using his CPAP because he could not afford it.  He has not had any problems with daytime sleepiness but does have problems snoring.   He is here today for followup and is doing well.  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  He got rid of a lot of stress and so he stopped taking his PM meds including Toprol at night and another BP med at night but he cannot remember which med it was.  He did that a few days and felt fine on it.  He denies any chest pain or pressure, SOB, DOE, PND, orthopnea, LE edema, dizziness, palpitations or syncope. He is compliant with his meds and is tolerating meds with no SE.     Past Medical History    Past Medical History:  Diagnosis Date   Chronic pain in left shoulder    Hyperlipidemia    Hypertension    Leukocytosis 11/20/2013   referred to heamtology in 2015   OSA on CPAP    mild with AHI 8.6/hr now on CPAP   Polyarthralgia    referred to rheum in 20a5   Past Surgical History:  Procedure Laterality Date   ANTERIOR CRUCIATE LIGAMENT REPAIR     left knee   APPENDECTOMY     CORONARY PRESSURE/FFR STUDY N/A 07/06/2020   Procedure: INTRAVASCULAR PRESSURE WIRE/FFR STUDY;  Surgeon: Lyn Records, MD;  Location: [facility] INVASIVE CV LAB;  Service: Cardiovascular;  Laterality: N/A;   FOOT SURGERY     screw right foot 5th metatarsal    KNEE SURGERY     left knee ligaments repair   LEFT HEART CATH AND CORONARY ANGIOGRAPHY N/A 07/06/2020   Procedure: LEFT HEART CATH AND CORONARY ANGIOGRAPHY;  Surgeon: Lyn Records, MD;  Location: [facility] INVASIVE CV LAB;  Service: Cardiovascular;  Laterality: N/A;    Allergies  No Known Allergies  History of Present Illness    Francisco Juarez is a 53 y.o. male with a hx of hypertension, hyperlipidemia, tobacco use, CAD s/p NSTEMI.  He presented to the hospital 07/02/20 and was diagnosed with NSTEMI.  Echocardiogram 07/02/2020 LVEF 60 to 65%, no RWMA, normal diastolic parameters, trivial MR.  Underwent cardiac catheterization 07/06/2020 showing 50-80% stenosis in mid to distal nondominant RCA, 50% proximal to mid ramus, 30 to 70% mid LAD percent, 90% ostial diagonal #1, 70% LAD with RFR 0.93.  The first diagonal was not treated as it could potentially involve LAD.  Given young age he was recommended for medical management.  LVEDP 19 mmHg consistent with diastolic heart failure presumed due to poorly controlled hypertension.  He was recommended for aggressive antianginal therapy and if angina remains limiting symptom after optimization and consideration of diagonal PCI would be appropriate.  Seen 07/27/20. Noted a few  episodes of chest discomfort and Imdur initiated. He had lipoprotein a collected as part of a research study which resulted at 180.   Repeat lipid panel 09/08/20 total cholesterol 96, HDL 28, LDL 52, triglycerides 78.   He is here today for followup and is doing well.  He denies any chest pain or pressure, SOB, DOE, PND, orthopnea, LE edema, dizziness, palpitations or syncope. He is compliant with his meds and is tolerating meds with no SE.   He says that his only problem is feeling tired in the am when he gets up and exhausted in the evenings. He recently underwent HST which showed mild OSA with an AHI of 8/hr.    EKGs/Labs/Other Studies Reviewed:   The following studies were reviewed  today:  Echo 07/02/2020  1. Left ventricular ejection fraction, by estimation, is 60 to 65%. The  left ventricle has normal function. The left ventricle has no regional  wall motion abnormalities. Left ventricular diastolic parameters were  normal.   2. Right ventricular systolic function is normal. The right ventricular  size is normal. Tricuspid regurgitation signal is inadequate for assessing  PA pressure.   3. The mitral valve is normal in structure. Trivial mitral valve  regurgitation. No evidence of mitral stenosis.   4. The aortic valve is normal in structure. Aortic valve regurgitation is  not visualized. No aortic stenosis is present.   5. The inferior vena cava is normal in size with greater than 50%  respiratory variability, suggesting right atrial pressure of 3 mmHg.   LHC 07/06/2020 Conclusion  Diffuse 60 to 80% stenosis in the mid to distal nondominant RCA, second obtuse marginal, 50% proximal to mid ramus, 30 to 70% mid LAD, and 90% ostial diagonal #1 (relatively large vessel and possibly the culprit). 70% mid LAD RFR 0.93 (abnormal less than 0.89) First diagonal was not treated as it could potentially involve the LAD.  The best approach given this patient's young age is aggressive medical management of angina. Normal LV systolic function with EF estimated to be 50 to 55%.  LVEDP 19 mmHg, consistent with diastolic heart failure likely related to Poorly controlled blood pressure.   RECOMMENDATIONS:   Aggressive antianginal therapy.  Once optimized, if angina remains a limiting symptom, consideration of diagonal PCI would then be appropriate. Aggressive preventive therapy: Blood pressure control; smoking cessation; LDL target less than 55. Started Plavix in addition to aspirin which should be continued for at least 6 months. Wean and DC nitroglycerin today.   Coronary Diagrams   Diagnostic Dominance: Left    EKG: EKG was not ordered today.   Recent Labs: 04/11/2022:  TSH 1.830  Recent Lipid Panel    Component Value Date/Time   CHOL 110 01/27/2022 0914   TRIG 78 01/27/2022 0914   HDL 28 (L) 01/27/2022 0914   CHOLHDL 3.9 01/27/2022 0914   CHOLHDL 6.5 07/02/2020 0757   VLDL 21 07/02/2020 0757   LDLCALC 66 01/27/2022 0914   Home Medications   Current Meds  Medication Sig   aspirin EC 81 MG tablet Take 1 tablet (81 mg total) by mouth daily. Swallow whole.   atorvastatin (LIPITOR) 80 MG tablet Take 1 tablet (80 mg total) by mouth daily.   clopidogrel (PLAVIX) 75 MG tablet Take 1 tablet (75 mg total) by mouth daily.   isosorbide mononitrate (IMDUR) 60 MG 24 hr tablet Take 1 tablet (60 mg total) by mouth daily.   losartan (COZAAR) 100 MG tablet Take 1 tablet (100 mg total)  by mouth daily.   metoprolol succinate (TOPROL-XL) 25 MG 24 hr tablet Take 1 tablet (25 mg total) by mouth 2 (two) times daily. (Patient taking differently: Take 25 mg by mouth daily.)   nitroGLYCERIN (NITROSTAT) 0.4 MG SL tablet Place 1 tablet (0.4 mg total) under the tongue every 5 (five) minutes as needed for chest pain.     Review of Systems      All other systems reviewed and are otherwise negative except as noted above.  Physical Exam    VS:  BP (!) 130/99 (BP Location: Left Arm)   Pulse 64   Ht 5\' 5"  (1.651 m)   Wt 176 lb 9.6 oz (80.1 kg)   SpO2 94%   BMI 29.39 kg/m  , BMI Body mass index is 29.39 kg/m.  Wt Readings from Last 3 Encounters:  03/15/23 176 lb 9.6 oz (80.1 kg)  07/12/22 180 lb (81.6 kg)  07/03/22 180 lb (81.6 kg)    GEN: Well nourished, well developed in no acute distress HEENT: Normal NECK: No JVD; No carotid bruits LYMPHATICS: No lymphadenopathy CARDIAC:RRR, no murmurs, rubs, gallops RESPIRATORY:  Clear to auscultation without rales, wheezing or rhonchi  ABDOMEN: Soft, non-tender, non-distended MUSCULOSKELETAL:  No edema; No deformity  SKIN: Warm and dry NEUROLOGIC:  Alert and oriented x 3 PSYCHIATRIC:  Normal affect   EKG  Interpretation Date/Time:  Thursday March 15 2023 10:00:36 EDT Ventricular Rate:  64 PR Interval:  168 QRS Duration:  76 QT Interval:  400 QTC Calculation: 412 R Axis:   24  Text Interpretation: Normal sinus rhythm Normal ECG When compared with ECG of 06-Jul-2020 08:43, No significant change was found Confirmed by Armanda Magic ([id number]) on 03/15/2023 10:15:01 AM   Assessment & Plan   OSA  -he had to stop using the CPAP because he could not afford it -he had very mild OSA and has no sx of excessive daytime sleepiness -he is still snoring so I will refer him to ENT  HTN  -DBP elevated on exam today -continue prescription drug management with Losartan 100mg  daily, Imdur 60mg  daily with PRN refills -he has only been taking Toprol XL 25mg  qam and not at night and also not taking another one of his BP meds but he cannot remember -he will call me to let me know which other BP med he stopped  ASCAD -s/p NSTEMI>>LHC 07/06/20 with first diagonal 90% stenosis but given young age and involvement of LAD recommended for medial therapy with consideration of diagonal PCI if recurrent angina despite maximal antianginal therapy.  -he has not had any anginal sx -continue prescription drug therapy with ASA 81mg  daily, Plavix 75mg  daily, Imdur 60mg  daily and statin  with PRN refills -continue BB  HLD -LDL goal < 70 -continue prescription drug management with Atorvastatin 80mg  daily with PRN refills -check FLP and CMET  Palpitations -this has been occurring sporadically -will check a 2 week ziopatch  Followup with me in 1 year    Signed, Armanda Magic, MD 03/15/2023, 10:11 AM [facility]

## 2023-03-15 NOTE — Patient Instructions (Addendum)
 Medication Instructions:  Your physician recommends that you continue on your current medications as directed. Please refer to the Current Medication list given to you today.  *If you need a refill on your cardiac medications before your next appointment, please call your pharmacy*   Lab Work: TODAY:  CMET & LIPID  If you have labs (blood work) drawn today and your tests are completely normal, you will receive your results only by: MyChart Message (if you have MyChart) OR A paper copy in the mail If you have any lab test that is abnormal or we need to change your treatment, we will call you to review the results.   Testing/Procedures: Christena Deem- Long Term Monitor Instructions  Your physician has requested you wear a ZIO patch monitor for 14 days.  This is a single patch monitor. [employer] supplies one patch monitor per enrollment. Additional stickers are not available. Please do not apply patch if you will be having a Nuclear Stress Test,  Echocardiogram, Cardiac CT, MRI, or Chest Xray during the period you would be wearing the  monitor. The patch cannot be worn during these tests. You cannot remove and re-apply the  ZIO XT patch monitor.  Your ZIO patch monitor will be mailed 3 day USPS to your address on file. It may take 3-5 days  to receive your monitor after you have been enrolled.  Once you have received your monitor, please review the enclosed instructions. Your monitor  has already been registered assigning a specific monitor serial # to you.  Billing and Patient Assistance Program Information  We have supplied [employer] with any of your insurance information on file for billing purposes. [employer] offers a sliding scale Patient Assistance Program for patients that do not have  insurance, or whose insurance does not completely cover the cost of the ZIO monitor.  You must apply for the Patient Assistance Program to qualify for this discounted rate.  To apply, please call [employer] at  [phone], select option 4, select option 2, ask to apply for  Patient Assistance Program. Meredeth Ide will ask your household income, and how many people  are in your household. They will quote your out-of-pocket cost based on that information.  [employer] will also be able to set up a 37-month, interest-free payment plan if needed.  Applying the monitor   Shave hair from upper left chest.  Hold abrader disc by orange tab. Rub abrader in 40 strokes over the upper left chest as  indicated in your monitor instructions.  Clean area with 4 enclosed alcohol pads. Let dry.  Apply patch as indicated in monitor instructions. Patch will be placed under collarbone on left  side of chest with arrow pointing upward.  Rub patch adhesive wings for 2 minutes. Remove white label marked "1". Remove the white  label marked "2". Rub patch adhesive wings for 2 additional minutes.  While looking in a mirror, press and release button in center of patch. A small green light will  flash 3-4 times. This will be your only indicator that the monitor has been turned on.  Do not shower for the first 24 hours. You may shower after the first 24 hours.  Press the button if you feel a symptom. You will hear a small click. Record Date, Time and  Symptom in the Patient Logbook.  When you are ready to remove the patch, follow instructions on the last 2 pages of Patient  Logbook. Stick patch monitor onto the last page of Patient Logbook.  Place Patient Logbook in the blue and white box. Use locking tab on box and tape box closed  securely. The blue and white box has prepaid postage on it. Please place it in the mailbox as  soon as possible. Your physician should have your test results approximately 7 days after the  monitor has been mailed back to [facility].  Call [facility] Customer Care at [phone] if you have questions regarding  your ZIO XT patch monitor. Call them immediately if you see an orange light  blinking on your  monitor.  If your monitor falls off in less than 4 days, contact our Monitor department at [phone].  If your monitor becomes loose or falls off after 4 days call [employer] at [phone] for  suggestions on securing your monitor   You have been referred to Dr. Jenne Pane ENT for snoring.  Someone will reach out to you to get that scheduled.    Follow-Up: At [facility], you and your health needs are our priority.  As part of our continuing mission to provide you with exceptional heart care, we have created designated Provider Care Teams.  These Care Teams include your primary Cardiologist (physician) and Advanced Practice Providers (APPs -  Physician Assistants and Nurse Practitioners) who all work together to provide you with the care you need, when you need it.  We recommend signing up for the patient portal called "MyChart".  Sign up information is provided on this After Visit Summary.  MyChart is used to connect with patients for Virtual Visits (Telemedicine).  Patients are able to view lab/test results, encounter notes, upcoming appointments, etc.  Non-urgent messages can be sent to your provider as well.   To learn more about what you can do with MyChart, go to [email or web address].    Your next appointment:   12 month(s)  Provider:   Armanda Magic, MD     Other Instructions     1st Floor: - Lobby - Registration  - Pharmacy  - Lab - Cafe  2nd Floor: - PV Lab - Diagnostic Testing (echo, CT, nuclear med)  3rd Floor: - Vacant  4th Floor: - TCTS (cardiothoracic surgery) - AFib Clinic - Structural Heart Clinic - Vascular Surgery  - Vascular Ultrasound  5th Floor: - HeartCare Cardiology (general and EP) - Clinical Pharmacy for coumadin, hypertension, lipid, weight-loss medications, and med management appointments    Valet parking services will be available as well.

## 2023-03-15 NOTE — Progress Notes (Unsigned)
 Enrolled patient for a 14 day Zio XT  monitor to be mailed to patients home

## 2023-03-20 ENCOUNTER — Encounter (HOSPITAL_BASED_OUTPATIENT_CLINIC_OR_DEPARTMENT_OTHER): Payer: Self-pay | Admitting: *Deleted

## 2023-03-20 NOTE — Progress Notes (Signed)
 LVM for pt to call the office.

## 2023-04-02 ENCOUNTER — Ambulatory Visit: Admitting: Cardiology

## 2023-04-23 DIAGNOSIS — R002 Palpitations: Secondary | ICD-10-CM | POA: Diagnosis not present

## 2023-04-25 ENCOUNTER — Telehealth: Payer: Self-pay | Admitting: Cardiology

## 2023-04-25 ENCOUNTER — Telehealth: Payer: Self-pay

## 2023-04-25 NOTE — Telephone Encounter (Signed)
 Patient returned RN's call regarding results.

## 2023-04-25 NOTE — Telephone Encounter (Signed)
-----   Message from Gaylyn Keas sent at 04/23/2023  9:28 AM EDT ----- Heart monitor showed normal rhythm with rare extra heartbeats from the top and bottom of the heart which are benign and do not require further treatment unless patient is significantly symptomatic.  Continue Toprol .  If he is having a lot of patient palpitations we can increase the Toprol  dose if needed he will need to let me know

## 2023-04-25 NOTE — Telephone Encounter (Signed)
 Spoke with pt regarding his results. Results relayed to pt. Pt stated he is no longer having any palpitations. Pt was advised to give us  a call if he does continue to have them. Pt verbalized understanding. All questions if any were answered.

## 2023-04-25 NOTE — Telephone Encounter (Signed)
 See phone note from today 4/23

## 2023-05-09 ENCOUNTER — Encounter (HOSPITAL_BASED_OUTPATIENT_CLINIC_OR_DEPARTMENT_OTHER): Payer: Self-pay | Admitting: Family Medicine

## 2023-05-09 ENCOUNTER — Ambulatory Visit (HOSPITAL_BASED_OUTPATIENT_CLINIC_OR_DEPARTMENT_OTHER): Admitting: Family Medicine

## 2023-05-09 VITALS — BP 132/87 | HR 71 | Ht 68.5 in | Wt 180.3 lb

## 2023-05-09 DIAGNOSIS — F17200 Nicotine dependence, unspecified, uncomplicated: Secondary | ICD-10-CM | POA: Diagnosis not present

## 2023-05-09 DIAGNOSIS — R748 Abnormal levels of other serum enzymes: Secondary | ICD-10-CM | POA: Insufficient documentation

## 2023-05-09 DIAGNOSIS — R1013 Epigastric pain: Secondary | ICD-10-CM | POA: Insufficient documentation

## 2023-05-09 DIAGNOSIS — Z0000 Encounter for general adult medical examination without abnormal findings: Secondary | ICD-10-CM

## 2023-05-09 DIAGNOSIS — R0602 Shortness of breath: Secondary | ICD-10-CM | POA: Diagnosis not present

## 2023-05-09 NOTE — Assessment & Plan Note (Signed)
 Given history of epigastric pain as well as substernal pain and concerns for reflux as well as elevated alkaline phosphatase level, feel would be appropriate to proceed with further evaluation with GI.  Referral placed today.  Did advise on general considerations related to reflux, provided handout with lifestyle modification recommendations.  Feel would also be important for patient to work towards tobacco cessation as this can also be a risk factor for reflux

## 2023-05-09 NOTE — Progress Notes (Signed)
    Procedures performed today:    None.  Independent interpretation of notes and tests performed by another provider:   None.  Brief History, Exam, Impression, and Recommendations:    BP 132/87 (BP Location: Right Arm, Patient Position: Sitting, Cuff Size: Normal)   Pulse 71   Ht 5' 8.5" (1.74 m)   Wt 180 lb 4.8 oz (81.8 kg)   SpO2 100%   BMI 27.02 kg/m   Patient has been having recent issues with epigastric/substernal chest pain primarily attributed to reflux symptoms.  He has had evaluation with cardiology and was wearing monitor recently.  He reports that no obvious abnormalities were seen during this evaluation.  He has not necessarily noted any foods specifically associated with recent symptoms. On exam, patient is in no acute distress, vital signs stable.  Cardiovascular exam with regular rate and rhythm, lungs clear to auscultation bilaterally.  Abdomen with normal bowel sounds, soft, nontender.  Elevated alkaline phosphatase level Assessment & Plan: On recent labs with cardiology, patient was found to have slightly elevated alkaline phosphatase, has increased on recent labs.  History and exam not strongly suggestive of underlying gallbladder disease, however recommend proceeding with recheck of hepatic function panel and we will also check GGT to further assess potential cause of alkaline phosphatase elevation  Orders: -     Hepatic function panel -     Gamma GT  Tobacco use disorder -     Pulmonary Visit  Shortness of breath Assessment & Plan: Patient additionally notes some shortness of breath.  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  Has had recent cardiac evaluation, denies any chest pain with his shortness of breath.  He does have greater than 30-pack-year history.  He has had prior CT chest for lung cancer screening which did show  structural changes consistent with emphysema. Given symptoms and history, feel would be appropriate to have further evaluation with pulmonology, patient in agreement, referral placed today  Orders: -     Pulmonary Visit  Epigastric pain Assessment & Plan: Given history of epigastric pain as well as substernal pain and concerns for reflux as well as elevated alkaline phosphatase level, feel would be appropriate to proceed with further evaluation with GI.  Referral placed today.  Did advise on general considerations related to reflux, provided handout with lifestyle modification recommendations.  Feel would also be important for patient to work towards tobacco cessation as this can also be a risk factor for reflux  Orders: -     Ambulatory referral to Gastroenterology  Wellness examination -     CBC with Differential/Platelet; Future -     Comprehensive metabolic panel with GFR; Future -     Hemoglobin A1c; Future -     Lipid panel; Future -     TSH Rfx on Abnormal to Free T4; Future  Return in about 3 months (around 08/09/2023) for CPE with fasting labs 1 week prior.   ___________________________________________ Saint Hank de Peru, MD, ABFM, CAQSM Primary Care and Sports Medicine [facility]

## 2023-05-09 NOTE — Assessment & Plan Note (Signed)
 On recent labs with cardiology, patient was found to have slightly elevated alkaline phosphatase, has increased on recent labs.  History and exam not strongly suggestive of underlying gallbladder disease, however recommend proceeding with recheck of hepatic function panel and we will also check GGT to further assess potential cause of alkaline phosphatase elevation

## 2023-05-09 NOTE — Patient Instructions (Signed)
  Medication Instructions:  Your physician recommends that you continue on your current medications as directed. Please refer to the Current Medication list given to you today. --If you need a refill on any your medications before your next appointment, please call your pharmacy first. If no refills are authorized on file call the office.-- Lab Work: Your physician has recommended that you have lab work today: today If you have labs (blood work) drawn today and your tests are completely normal, you will receive your results via MyChart message OR a phone call from our staff.  Please ensure you check your voicemail in the event that you authorized detailed messages to be left on a delegated number. If you have any lab test that is abnormal or we need to change your treatment, we will call you to review the results.   Follow-Up: Your next appointment:   Your physician recommends that you schedule a follow-up appointment in: 3 month physical  with Dr. de Peru  You will receive a text message or e-mail with a link to a survey about your care and experience with us  today! We would greatly appreciate your feedback!   Thanks for letting us  be apart of your health journey!!  Primary Care and Sports Medicine   Dr. Court Distance Peru   We encourage you to activate your patient portal called "MyChart".  Sign up information is provided on this After Visit Summary.  MyChart is used to connect with patients for Virtual Visits (Telemedicine).  Patients are able to view lab/test results, encounter notes, upcoming appointments, etc.  Non-urgent messages can be sent to your provider as well. To learn more about what you can do with MyChart, please visit --  [email or web address].

## 2023-05-09 NOTE — Assessment & Plan Note (Signed)
 Patient additionally notes some shortness of breath.  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  Has had recent cardiac evaluation, denies any chest pain with his shortness of breath.  He does have greater than 30-pack-year history.  He has had prior CT chest for lung cancer screening which did show structural changes consistent with emphysema. Given symptoms and history, feel would be appropriate to have further evaluation with pulmonology, patient in agreement, referral placed today

## 2023-05-10 ENCOUNTER — Encounter (HOSPITAL_BASED_OUTPATIENT_CLINIC_OR_DEPARTMENT_OTHER): Payer: Self-pay | Admitting: Family Medicine

## 2023-05-10 LAB — HEPATIC FUNCTION PANEL
ALT: 20 IU/L (ref 0–44)
AST: 16 IU/L (ref 0–40)
Albumin: 4.3 g/dL (ref 3.8–4.9)
Alkaline Phosphatase: 146 IU/L — ABNORMAL HIGH (ref 44–121)
Bilirubin Total: 0.3 mg/dL (ref 0.0–1.2)
Bilirubin, Direct: 0.14 mg/dL (ref 0.00–0.40)
Total Protein: 6.4 g/dL (ref 6.0–8.5)

## 2023-05-10 LAB — GAMMA GT: GGT: 31 IU/L (ref 0–65)

## 2023-05-16 ENCOUNTER — Encounter (HOSPITAL_BASED_OUTPATIENT_CLINIC_OR_DEPARTMENT_OTHER): Payer: Self-pay

## 2023-06-01 ENCOUNTER — Encounter (HOSPITAL_BASED_OUTPATIENT_CLINIC_OR_DEPARTMENT_OTHER): Payer: Self-pay | Admitting: Family Medicine

## 2023-06-03 ENCOUNTER — Encounter: Payer: Self-pay | Admitting: Cardiology

## 2023-06-14 ENCOUNTER — Other Ambulatory Visit (HOSPITAL_BASED_OUTPATIENT_CLINIC_OR_DEPARTMENT_OTHER): Payer: Self-pay | Admitting: Cardiology

## 2023-06-14 DIAGNOSIS — I10 Essential (primary) hypertension: Secondary | ICD-10-CM

## 2023-06-14 DIAGNOSIS — I25118 Atherosclerotic heart disease of native coronary artery with other forms of angina pectoris: Secondary | ICD-10-CM

## 2023-06-15 ENCOUNTER — Other Ambulatory Visit (HOSPITAL_BASED_OUTPATIENT_CLINIC_OR_DEPARTMENT_OTHER): Payer: Self-pay | Admitting: Cardiology

## 2023-06-24 ENCOUNTER — Other Ambulatory Visit: Payer: Self-pay | Admitting: Cardiology

## 2023-06-27 NOTE — Telephone Encounter (Signed)
 Refilled per OV note 03/2023.

## 2023-06-27 NOTE — Telephone Encounter (Signed)
 This is the Pt of Dr. Shlomo that I was talking about.

## 2023-06-28 ENCOUNTER — Other Ambulatory Visit (HOSPITAL_BASED_OUTPATIENT_CLINIC_OR_DEPARTMENT_OTHER): Payer: Self-pay | Admitting: Family

## 2023-06-28 ENCOUNTER — Other Ambulatory Visit: Payer: Self-pay | Admitting: Cardiology

## 2023-06-28 DIAGNOSIS — I25118 Atherosclerotic heart disease of native coronary artery with other forms of angina pectoris: Secondary | ICD-10-CM

## 2023-06-28 DIAGNOSIS — I10 Essential (primary) hypertension: Secondary | ICD-10-CM

## 2023-06-28 MED ORDER — CLOPIDOGREL BISULFATE 75 MG PO TABS: 75.0000 mg | ORAL_TABLET | Freq: Every day | ORAL | 2 refills | Status: AC

## 2023-06-30 ENCOUNTER — Other Ambulatory Visit: Payer: Self-pay | Admitting: Cardiology

## 2023-07-02 ENCOUNTER — Other Ambulatory Visit: Payer: Self-pay

## 2023-07-02 ENCOUNTER — Other Ambulatory Visit (HOSPITAL_BASED_OUTPATIENT_CLINIC_OR_DEPARTMENT_OTHER): Payer: Self-pay

## 2023-07-02 MED ORDER — ATORVASTATIN CALCIUM 80 MG PO TABS: 80.0000 mg | ORAL_TABLET | Freq: Every day | ORAL | 3 refills | Status: AC

## 2023-07-02 MED ORDER — METOPROLOL SUCCINATE ER 25 MG PO TB24: 25.0000 mg | ORAL_TABLET | Freq: Two times a day (BID) | ORAL | 2 refills | Status: AC

## 2023-07-02 MED ORDER — NITROGLYCERIN 0.4 MG SL SUBL: 0.4000 mg | SUBLINGUAL_TABLET | SUBLINGUAL | 8 refills | Status: AC | PRN

## 2023-07-12 ENCOUNTER — Encounter: Payer: Self-pay | Admitting: Family Medicine

## 2023-07-20 ENCOUNTER — Encounter: Payer: Self-pay | Admitting: Advanced Practice Midwife

## 2023-08-09 ENCOUNTER — Encounter (HOSPITAL_BASED_OUTPATIENT_CLINIC_OR_DEPARTMENT_OTHER): Admitting: Family Medicine

## 2023-09-28 ENCOUNTER — Encounter (HOSPITAL_BASED_OUTPATIENT_CLINIC_OR_DEPARTMENT_OTHER): Payer: Self-pay | Admitting: Family Medicine

## 2023-09-28 ENCOUNTER — Ambulatory Visit (INDEPENDENT_AMBULATORY_CARE_PROVIDER_SITE_OTHER): Payer: Self-pay | Admitting: Family Medicine

## 2023-09-28 VITALS — BP 128/93 | HR 93 | Ht 68.5 in | Wt 176.8 lb

## 2023-09-28 DIAGNOSIS — F419 Anxiety disorder, unspecified: Secondary | ICD-10-CM | POA: Insufficient documentation

## 2023-09-28 DIAGNOSIS — Z23 Encounter for immunization: Secondary | ICD-10-CM

## 2023-09-28 DIAGNOSIS — Z0000 Encounter for general adult medical examination without abnormal findings: Secondary | ICD-10-CM

## 2023-09-28 MED ORDER — ESCITALOPRAM OXALATE 5 MG PO TABS: 5.0000 mg | ORAL_TABLET | Freq: Every day | ORAL | 1 refills | Status: DC

## 2023-09-28 NOTE — Progress Notes (Signed)
 Subjective:    CC: Annual Physical Exam  HPI: Francisco Juarez is a 53 y.o. presenting for annual physical  I reviewed the past medical history, family history, social history, surgical history, and allergies today and no changes were needed.  Please see the problem list section below in epic for further details.  Past Medical History: Past Medical History:  Diagnosis Date   Chronic pain in left shoulder    GERD (gastroesophageal reflux disease) 06/02/2020   Hyperlipidemia    Hypertension    Leukocytosis 11/20/2013   referred to heamtology in 2015   OSA on CPAP    mild with AHI 8.6/hr now on CPAP   Polyarthralgia    referred to rheum in 20a5   Sleep apnea 07/02/2020   Past Surgical History: Past Surgical History:  Procedure Laterality Date   ANTERIOR CRUCIATE LIGAMENT REPAIR     left knee   APPENDECTOMY     CORONARY PRESSURE/FFR STUDY N/A 07/06/2020   Procedure: INTRAVASCULAR PRESSURE WIRE/FFR STUDY;  Surgeon: Sharps Victory ORN, MD;  Location: [facility];  Service: Cardiovascular;  Laterality: N/A;   FOOT SURGERY     screw right foot 5th metatarsal   KNEE SURGERY     left knee ligaments repair   LEFT HEART CATH AND CORONARY ANGIOGRAPHY N/A 07/06/2020   Procedure: LEFT HEART CATH AND CORONARY ANGIOGRAPHY;  Surgeon: Sharps Victory ORN, MD;  Location: [facility];  Service: Cardiovascular;  Laterality: N/A;   Social History: Social History   Socioeconomic History   Marital status: Married    Spouse name: Not on file   Number of children: Not on file   Years of education: Not on file   Highest education level: Not on file  Occupational History   Not on file  Tobacco Use   Smoking status: Every Day    Current packs/day: 1.00    Average packs/day: 1 pack/day for 19.0 years (19.0 ttl pk-yrs)    Types: Cigarettes    Passive exposure: Current   Smokeless tobacco: Never   Tobacco comments:    quit April 2006, smoked 1 1/2 pack/day  x 10 years  Vaping Use    Vaping status: Never Used  Substance and Sexual Activity   Alcohol use: No   Drug use: No   Sexual activity: Yes    Birth control/protection: None  Other Topics Concern   Not on file  Social History Narrative   Reviewed history from 01/18/2007 and no changes required:         Married         Former Smoker -  quit April 2006.  smoked 1 1/2 pack/day x 10 years         Alcohol use-no         Drug use-no         Occupation:  Probation officer    Social Drivers of Corporate investment banker Strain: Not on file  Food Insecurity: Not on file  Transportation Needs: Not on file  Physical Activity: Not on file  Stress: Not on file  Social Connections: Not on file   Family History: Family History  Problem Relation Age of Onset   Hypertension Other    Diabetes Other    Coronary artery disease Other    Cancer Maternal Grandmother        lung ca   Hyperlipidemia Mother    Hypertension Mother    Allergies: No Known Allergies Medications: See  med rec.  Review of Systems: No headache, visual changes, nausea, vomiting, diarrhea, constipation, dizziness, abdominal pain, skin rash, fevers, chills, night sweats, swollen lymph nodes, weight loss, chest pain, body aches, joint swelling, muscle aches, shortness of breath, mood changes, visual or auditory hallucinations.  Objective:    BP (!) 128/95 (BP Location: Left Arm, Patient Position: Sitting, Cuff Size: Normal)   Pulse 93   Ht 5' 8.5 (1.74 m)   Wt 176 lb 12.8 oz (80.2 kg)   SpO2 100%   BMI 26.49 kg/m   General: Well Developed, well nourished, and in no acute distress.  Neuro: Alert and oriented x3, extra-ocular muscles intact, sensation grossly intact. Cranial nerves II through XII are intact, motor, sensory, and coordinative functions are all intact. HEENT: Normocephalic, atraumatic, pupils equal round reactive to light, neck supple, no masses, no lymphadenopathy, thyroid nonpalpable. Oropharynx, nasopharynx, external  ear canals are unremarkable. Skin: Warm and dry, no rashes noted. Cardiac: Regular rate and rhythm, no murmurs rubs or gallops. Respiratory: Clear to auscultation bilaterally. Not using accessory muscles, speaking in full sentences. Abdominal: Soft, nontender, nondistended, positive bowel sounds, no masses, no organomegaly. Musculoskeletal: Shoulder, elbow, wrist, hip, knee, ankle stable, and with full range of motion.  Impression and Recommendations:    Wellness examination Assessment & Plan: Routine HCM labs ordered. HCM reviewed/discussed. Anticipatory guidance regarding healthy weight, lifestyle and choices given. Recommend healthy diet.  Recommend approximately 150 minutes/week of moderate intensity exercise Recommend regular dental and vision exams Always use seatbelt/lap and shoulder restraints Recommend using smoke alarms and checking batteries at least twice a year Recommend using sunscreen when outside Discussed immunization recommendations   Anxiety Assessment & Plan: Patient also has some concerns related to anxiety - indicates increased symptoms over the past 6 months. Has felt more worry, anxiousness. Will feel like he has some palpitations, trouble relaxing. Denies any history of anxiety, no family history that he is aware of - but then recalls that his sister does have anxiety. GAD 7 with score of 8 today. We discussed management options with medication and therapy. He would be open to starting medication at this time. We reviewed options, will start with low dose SSRI.   Return in about 2 weeks (around 10/12/2023) for med check, can be virtual.   ___________________________________________ Deanna Boehlke de [place], MD, ABFM, [facility] Primary Care and Sports Medicine [facility]

## 2023-09-28 NOTE — Assessment & Plan Note (Signed)
 Patient also has some concerns related to anxiety - indicates increased symptoms over the past 6 months. Has felt more worry, anxiousness. Will feel like he has some palpitations, trouble relaxing. Denies any history of anxiety, no family history that he is aware of - but then recalls that his sister does have anxiety. GAD 7 with score of 8 today. We discussed management options with medication and therapy. He would be open to starting medication at this time. We reviewed options, will start with low dose SSRI.

## 2023-09-28 NOTE — Patient Instructions (Signed)
  Medication Instructions:  Your physician recommends that you continue on your current medications as directed. Please refer to the Current Medication list given to you today. --If you need a refill on any your medications before your next appointment, please call your pharmacy first. If no refills are authorized on file call the office.-- Lab Work: Your physician has recommended that you have lab work today: today If you have labs (blood work) drawn today and your tests are completely normal, you will receive your results via MyChart message OR a phone call from our staff.  Please ensure you check your voicemail in the event that you authorized detailed messages to be left on a delegated number. If you have any lab test that is abnormal or we need to change your treatment, we will call you to review the results.   Follow-Up: Your next appointment:   Your physician recommends that you schedule a follow-up appointment in: 2 weeks med check virtual  with Dr. de [place]  You will receive a text message or e-mail with a link to a survey about your care and experience with us  today! We would greatly appreciate your feedback!   Thanks for letting us  be apart of your health journey!!  Primary Care and Sports Medicine   Dr. Quintin sheerer Peru   We encourage you to activate your patient portal called MyChart.  Sign up information is provided on this After Visit Summary.  MyChart is used to connect with patients for Virtual Visits (Telemedicine).  Patients are able to view lab/test results, encounter notes, upcoming appointments, etc.  Non-urgent messages can be sent to your provider as well. To learn more about what you can do with MyChart, please visit --  [email or web address].

## 2023-09-28 NOTE — Assessment & Plan Note (Signed)

## 2023-09-29 LAB — COMPREHENSIVE METABOLIC PANEL WITH GFR
ALT: 13 IU/L (ref 0–44)
AST: 18 IU/L (ref 0–40)
Albumin: 4.4 g/dL (ref 3.8–4.9)
Alkaline Phosphatase: 123 IU/L (ref 47–123)
BUN/Creatinine Ratio: 13 (ref 9–20)
BUN: 16 mg/dL (ref 6–24)
Bilirubin Total: 0.3 mg/dL (ref 0.0–1.2)
CO2: 21 mmol/L (ref 20–29)
Calcium: 9.5 mg/dL (ref 8.7–10.2)
Chloride: 105 mmol/L (ref 96–106)
Creatinine, Ser: 1.24 mg/dL (ref 0.76–1.27)
Globulin, Total: 2.1 g/dL (ref 1.5–4.5)
Glucose: 86 mg/dL (ref 70–99)
Potassium: 4.4 mmol/L (ref 3.5–5.2)
Sodium: 141 mmol/L (ref 134–144)
Total Protein: 6.5 g/dL (ref 6.0–8.5)
eGFR: 70 mL/min/1.73 (ref >59)

## 2023-09-29 LAB — LIPID PANEL
Chol/HDL Ratio: 3.8 ratio (ref 0.0–5.0)
Cholesterol, Total: 102 mg/dL (ref 100–199)
HDL: 27 mg/dL — ABNORMAL LOW (ref >39)
LDL Chol Calc (NIH): 51 mg/dL (ref 0–99)
Triglycerides: 135 mg/dL (ref 0–149)
VLDL Cholesterol Cal: 24 mg/dL (ref 5–40)

## 2023-09-29 LAB — CBC WITH DIFFERENTIAL/PLATELET
Basophils Absolute: 0 x10E3/uL (ref 0.0–0.2)
Basos: 0 %
EOS (ABSOLUTE): 0.1 x10E3/uL (ref 0.0–0.4)
Eos: 1 %
Hematocrit: 41.7 % (ref 37.5–51.0)
Hemoglobin: 13.4 g/dL (ref 13.0–17.7)
Immature Grans (Abs): 0 x10E3/uL (ref 0.0–0.1)
Immature Granulocytes: 0 %
Lymphocytes Absolute: 3.2 x10E3/uL — ABNORMAL HIGH (ref 0.7–3.1)
Lymphs: 30 %
MCH: 30.2 pg (ref 26.6–33.0)
MCHC: 32.1 g/dL (ref 31.5–35.7)
MCV: 94 fL (ref 79–97)
Monocytes Absolute: 1.4 x10E3/uL — ABNORMAL HIGH (ref 0.1–0.9)
Monocytes: 13 %
Neutrophils Absolute: 6 x10E3/uL (ref 1.4–7.0)
Neutrophils: 56 %
Platelets: 292 x10E3/uL (ref 150–450)
RBC: 4.44 x10E6/uL (ref 4.14–5.80)
RDW: 13.9 % (ref 11.6–15.4)
WBC: 10.8 x10E3/uL (ref 3.4–10.8)

## 2023-09-29 LAB — HEMOGLOBIN A1C
Est. average glucose Bld gHb Est-mCnc: 123 mg/dL
Hgb A1c MFr Bld: 5.9 % — ABNORMAL HIGH (ref 4.8–5.6)

## 2023-09-29 LAB — TSH RFX ON ABNORMAL TO FREE T4: TSH: 1.12 u[IU]/mL (ref 0.450–4.500)

## 2023-10-01 ENCOUNTER — Ambulatory Visit (HOSPITAL_BASED_OUTPATIENT_CLINIC_OR_DEPARTMENT_OTHER): Payer: Self-pay | Admitting: Family Medicine

## 2023-10-12 ENCOUNTER — Encounter (HOSPITAL_BASED_OUTPATIENT_CLINIC_OR_DEPARTMENT_OTHER): Payer: Self-pay | Admitting: Family Medicine

## 2023-10-12 ENCOUNTER — Telehealth (INDEPENDENT_AMBULATORY_CARE_PROVIDER_SITE_OTHER): Payer: Self-pay | Admitting: Family Medicine

## 2023-10-12 DIAGNOSIS — F419 Anxiety disorder, unspecified: Secondary | ICD-10-CM

## 2023-10-12 MED ORDER — ESCITALOPRAM OXALATE 10 MG PO TABS: 10.0000 mg | ORAL_TABLET | Freq: Every day | ORAL | 1 refills | Status: DC

## 2023-10-12 NOTE — Assessment & Plan Note (Signed)
 Patient continues with Lexapro  at 5 mg dose.  Denies any issues with medication.  He has not had any obvious side effects.  Has not noted any significant change in regards to symptoms. We did discuss options today and he would like to proceed with dose titration at this time.  We will increase to 10 mg dose.  Advised on potential side effects.  We will plan to follow-up in about 1 month to assess progress with medication.  If having any issues with medication or symptoms, recommend returning to the office sooner.

## 2023-10-12 NOTE — Progress Notes (Signed)
   Virtual Visit   I connected with  Francisco Juarez  on 10/12/23 by telehealth and verified that I am speaking with the correct person using two identifiers. Visit completed via video.  I discussed the limitations, risks, security and privacy concerns of performing an evaluation and management service by telephone, including the higher likelihood of inaccurate diagnosis and treatment, and the availability of in person appointments.  We also discussed the likely need of an additional face to face encounter for complete and high quality delivery of care.  I also discussed with the patient that there may be a patient responsible charge related to this service. The patient expressed understanding and wishes to proceed.  Provider location is in medical facility. Patient location is at their home, different from provider location. People involved in care of the patient during this telehealth encounter were myself, my nurse/medical assistant, and my front office/scheduling team member.  Review of Systems: No fevers, chills, night sweats, weight loss, chest pain, or shortness of breath.   Objective Findings:    General: Speaking full sentences, no audible heavy breathing.  Sounds alert and appropriately interactive.    Independent interpretation of tests performed by another provider:   None.  Brief History, Exam, Impression, and Recommendations:    Anxiety Patient continues with Lexapro  at 5 mg dose.  Denies any issues with medication.  He has not had any obvious side effects.  Has not noted any significant change in regards to symptoms. We did discuss options today and he would like to proceed with dose titration at this time.  We will increase to 10 mg dose.  Advised on potential side effects.  We will plan to follow-up in about 1 month to assess progress with medication.  If having any issues with medication or symptoms, recommend returning to the office sooner.  I discussed the  above assessment and treatment plan with the patient. The patient was provided an opportunity to ask questions and all were answered. The patient agreed with the plan and demonstrated an understanding of the instructions.   The patient was advised to call back or seek an in-person evaluation if the symptoms worsen or if the condition fails to improve as anticipated.   I provided 12 minutes of face to face and non-face-to-face time during this encounter date, time was needed to gather information, review chart, records, communicate/coordinate with staff remotely, as well as complete documentation.   ___________________________________________ Andrae Claunch de [place], MD, ABFM, CAQSM Primary Care and Sports Medicine [facility]

## 2023-11-12 ENCOUNTER — Telehealth (INDEPENDENT_AMBULATORY_CARE_PROVIDER_SITE_OTHER): Payer: Self-pay | Admitting: Family Medicine

## 2023-11-12 ENCOUNTER — Encounter (HOSPITAL_BASED_OUTPATIENT_CLINIC_OR_DEPARTMENT_OTHER): Payer: Self-pay | Admitting: Family Medicine

## 2023-11-12 DIAGNOSIS — F419 Anxiety disorder, unspecified: Secondary | ICD-10-CM

## 2023-11-12 NOTE — Progress Notes (Signed)
   Virtual Visit   I connected with  Francisco Juarez  on 11/12/23 by telehealth and verified that I am speaking with the correct person using two identifiers. Visit completed via video.   I discussed the limitations, risks, security and privacy concerns of performing an evaluation and management service by telephone, including the higher likelihood of inaccurate diagnosis and treatment, and the availability of in person appointments.  We also discussed the likely need of an additional face to face encounter for complete and high quality delivery of care.  I also discussed with the patient that there may be a patient responsible charge related to this service. The patient expressed understanding and wishes to proceed.  Provider location is in medical facility. Patient location is at their home, different from provider location. People involved in care of the patient during this telehealth encounter were myself, my nurse/medical assistant, and my front office/scheduling team member.  Review of Systems: No fevers, chills, night sweats, weight loss, chest pain, or shortness of breath.   Objective Findings:    General: Speaking full sentences, no audible heavy breathing.  Sounds alert and appropriately interactive.    Independent interpretation of tests performed by another provider:   None.  Brief History, Exam, Impression, and Recommendations:    Anxiety Patient continues with Lexapro .  He does note that since increasing to 10 mg dose he has been experiencing more drowsiness during the day.  Particularly, he notes that if he does sit down at all he will find himself falling asleep.  He has generally been able to function well during the day, no issues at work.  Issue primarily is related to when he sits down or has a quiet room at home and will find himself falling asleep very easily.  He does not feel that there has been any significant difference in regards to symptom control between  the 5 mg dosing and 10 mg dose.  Does not recall significant issues with drowsiness at 5 mg dose. We discussed considerations including adjusting timing of Lexapro , switching to taking this at night may allow for less drowsiness during the day.  We could also switch back to 5 mg dose of medication to see if drowsiness resolves and if symptoms continue to be appropriately controlled. Additionally, could switch medication entirely. For now he will adjust timing of administration of medication.  If continue to have issues, then we will decrease to 5 mg dose. Will plan to follow-up in 2 to 3 months or sooner as needed  I discussed the above assessment and treatment plan with the patient. The patient was provided an opportunity to ask questions and all were answered. The patient agreed with the plan and demonstrated an understanding of the instructions.  The patient was advised to call back or seek an in-person evaluation if the symptoms worsen or if the condition fails to improve as anticipated.  I provided 12 minutes of face to face and non-face-to-face time during this encounter date, time was needed to gather information, review chart, records, communicate/coordinate with staff remotely, as well as complete documentation.   ___________________________________________ Francisco [place]up de [place], MD, ABFM, CAQSM Primary Care and Sports Medicine [facility]

## 2023-11-12 NOTE — Assessment & Plan Note (Signed)
 Patient continues with Lexapro .  He does note that since increasing to 10 mg dose he has been experiencing more drowsiness during the day.  Particularly, he notes that if he does sit down at all he will find himself falling asleep.  He has generally been able to function well during the day, no issues at work.  Issue primarily is related to when he sits down or has a quiet room at home and will find himself falling asleep very easily.  He does not feel that there has been any significant difference in regards to symptom control between the 5 mg dosing and 10 mg dose.  Does not recall significant issues with drowsiness at 5 mg dose. We discussed considerations including adjusting timing of Lexapro , switching to taking this at night may allow for less drowsiness during the day.  We could also switch back to 5 mg dose of medication to see if drowsiness resolves and if symptoms continue to be appropriately controlled. Additionally, could switch medication entirely. For now he will adjust timing of administration of medication.  If continue to have issues, then we will decrease to 5 mg dose. Will plan to follow-up in 2 to 3 months or sooner as needed

## 2023-12-03 ENCOUNTER — Encounter (HOSPITAL_BASED_OUTPATIENT_CLINIC_OR_DEPARTMENT_OTHER): Payer: Self-pay | Admitting: Family Medicine

## 2023-12-05 MED ORDER — ESCITALOPRAM OXALATE 10 MG PO TABS: 10.0000 mg | ORAL_TABLET | Freq: Every day | ORAL | 0 refills | Status: AC

## 2023-12-05 NOTE — Addendum Note (Signed)
 Addended by: RAYANN REXENE HERO on: 12/05/2023 04:54 PM   Modules accepted: Orders

## 2024-02-13 ENCOUNTER — Ambulatory Visit (HOSPITAL_BASED_OUTPATIENT_CLINIC_OR_DEPARTMENT_OTHER): Payer: Self-pay | Admitting: Family Medicine

## 2024-04-21 ENCOUNTER — Ambulatory Visit: Payer: Self-pay | Admitting: Cardiology
